# Patient Record
Sex: Female | Born: 2000 | Race: Black or African American | Hispanic: No | Marital: Single | Smoking: Never smoker
Health system: Southern US, Community
[De-identification: ages and names within clinical notes are randomized; demographics above are authoritative.]

## PROBLEM LIST (undated history)

## (undated) DIAGNOSIS — R0981 Nasal congestion: Secondary | ICD-10-CM

## (undated) DIAGNOSIS — J302 Other seasonal allergic rhinitis: Secondary | ICD-10-CM

## (undated) DIAGNOSIS — J353 Hypertrophy of tonsils with hypertrophy of adenoids: Secondary | ICD-10-CM

---
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
## 2018-01-12 ENCOUNTER — Ambulatory Visit
Admission: RE | Admit: 2018-01-12 | Discharge: 2018-01-12 | Disposition: A | Discharge status: Home / Self Care | Payer: Medicaid Other | Source: Ambulatory Visit | Attending: Pediatrics | Admitting: Pediatrics

## 2018-01-12 DIAGNOSIS — R1030 Lower abdominal pain, unspecified: Secondary | ICD-10-CM

## 2018-01-12 DIAGNOSIS — R102 Pelvic and perineal pain: Secondary | ICD-10-CM | POA: Diagnosis not present

## 2018-01-23 ENCOUNTER — Other Ambulatory Visit (HOSPITAL_COMMUNITY): Payer: Medicaid Other

## 2018-01-23 ENCOUNTER — Ambulatory Visit (HOSPITAL_COMMUNITY)
Admission: RE | Admit: 2018-01-23 | Discharge: 2018-01-23 | Disposition: A | Discharge status: Home / Self Care | Payer: Medicaid Other | Source: Ambulatory Visit | Attending: Pediatrics | Admitting: Pediatrics

## 2018-01-23 DIAGNOSIS — R079 Chest pain, unspecified: Secondary | ICD-10-CM | POA: Diagnosis not present

## 2018-02-03 ENCOUNTER — Encounter: Payer: Self-pay | Admitting: Pediatrics

## 2018-02-08 ENCOUNTER — Emergency Department (HOSPITAL_COMMUNITY)
Admission: EM | Admit: 2018-02-08 | Discharge: 2018-02-08 | Disposition: A | Discharge status: Home / Self Care | Payer: Medicaid Other | Attending: Emergency Medicine | Admitting: Emergency Medicine

## 2018-02-08 ENCOUNTER — Encounter (HOSPITAL_COMMUNITY): Payer: Self-pay | Admitting: Emergency Medicine

## 2018-02-08 ENCOUNTER — Other Ambulatory Visit: Payer: Self-pay

## 2018-02-08 DIAGNOSIS — R079 Chest pain, unspecified: Secondary | ICD-10-CM | POA: Insufficient documentation

## 2018-02-08 DIAGNOSIS — J45909 Unspecified asthma, uncomplicated: Secondary | ICD-10-CM | POA: Insufficient documentation

## 2018-02-08 DIAGNOSIS — R0789 Other chest pain: Secondary | ICD-10-CM | POA: Diagnosis not present

## 2018-02-08 LAB — BASIC METABOLIC PANEL
ANION GAP: 9 (ref 5–15)
BUN: 14 mg/dL (ref 6–20)
CALCIUM: 9.2 mg/dL (ref 8.9–10.3)
CHLORIDE: 106 mmol/L (ref 98–111)
CO2: 24 mmol/L (ref 22–32)
Creatinine, Ser: 0.71 mg/dL (ref 0.44–1.00)
GFR calc non Af Amer: >60 mL/min (ref >60)
Glucose, Bld: 88 mg/dL (ref 70–99)
Potassium: 3.7 mmol/L (ref 3.5–5.1)
Sodium: 139 mmol/L (ref 135–145)

## 2018-02-08 LAB — CBC
HCT: 38.1 % (ref 36.0–46.0)
HEMOGLOBIN: 13 g/dL (ref 12.0–15.0)
MCH: 27.1 pg (ref 26.0–34.0)
MCHC: 34.1 g/dL (ref 30.0–36.0)
MCV: 79.4 fL (ref 78.0–100.0)
Platelets: 305 10*3/uL (ref 150–400)
RBC: 4.8 MIL/uL (ref 3.87–5.11)
RDW: 14.4 % (ref 11.5–15.5)
WBC: 6.9 10*3/uL (ref 4.0–10.5)

## 2018-02-08 LAB — I-STAT TROPONIN, ED: Troponin i, poc: 0 ng/mL (ref 0.00–0.08)

## 2018-02-08 MED ORDER — GI COCKTAIL ~~LOC~~
30.0000 mL | Freq: Once | ORAL | Status: AC
  Administered 2018-02-08: 30 mL via ORAL
  Filled 2018-02-08: qty 30

## 2018-02-08 NOTE — Discharge Instructions (Signed)
Your EKG, blood work and cardiac marker were all normal today. This is good news. I am very confident that your chest pain is not coming from the heart or lungs. With your history and complaints, your chest pain could be coming from a musculoskeletal issue (such as muscle strain), GI related (such as heart burn) or stress.  I have placed information for a cardiology group below, but I encourage you to discuss this further with your PCP before seeing the cardiologist.

## 2018-02-08 NOTE — ED Triage Notes (Addendum)
Pt c/o R chest pain and R axilla pain x 3 months. Pt states pain has worsened today, pain is sharp and intermittent.

## 2018-02-08 NOTE — ED Provider Notes (Signed)
[facility]-EMERGENCY DEPT Provider Note  CSN: [id number] Arrival date & time: 02/08/18  1825  History   Chief Complaint Chief Complaint  Patient presents with  . Chest Pain    R axilla   HPI Adriana Wright is a 17 y.o. female with a medical history of asthma and allergies who presented to the ED for right sided chest pain x3 months. She describes intermittent pain that changes in quality (sharp vs achy vs dull) and sometimes radiates to her back. Patient states that she has some episodes where she has had SOB or abdominal pain. Episodes typically last 5-10 minutes and resolve on their own without intervention, but states today the pain went on for 2 hours. Patient has tried Albuterol inhaler prior to coming to the ED. Denies fever, palpitations, leg swelling, diaphoresis, N/V, change in bowel habits, urinary or vaginal complaints.  Past Medical History:  Diagnosis Date  . Adenotonsillar hypertrophy 09/2011   no snoring/apnea/waking up coughing or choking, per mother  . Asthma    prn inhaler  . Nasal congestion 10/03/2011  . Seasonal allergies     Patient Active Problem List   Diagnosis Date Noted  . Adjustment disorder with anxiety 10/23/2015  . Rhinitis, allergic 07/18/2015  . Bereavement 07/18/2015  . Acne 07/16/2013  . Mild persistent asthma, well controlled 07/16/2013  . Dysmenorrhea 07/16/2013    Past Surgical History:  Procedure Laterality Date  . TONSILLECTOMY AND ADENOIDECTOMY  10/08/2011   Procedure: TONSILLECTOMY AND ADENOIDECTOMY;  Surgeon: Darletta Moll, MD;  Location: [facility];  Service: ENT;  Laterality: N/A;     OB History    Gravida  0   Para  0   Term  0   Preterm  0   AB  0   Living  0     SAB  0   TAB  0   Ectopic  0   Multiple  0   Live Births               Home Medications    Prior to Admission medications   Medication Sig Start Date End Date Taking? Authorizing Provider  albuterol  (PROAIR HFA) 108 (90 Base) MCG/ACT inhaler Inhale 1-2 puffs into the lungs every 4 (four) hours as needed for wheezing or shortness of breath. Always use spacer. 12/30/16  Yes Prose, Bunnlevel Bing, MD  cetirizine (ZYRTEC) 10 MG tablet Take 1 tablet (10 mg total) by mouth daily. Patient not taking: Reported on 02/08/2018 07/18/15   Theadore Nan, MD    Family History Family History  Problem Relation Age of Onset  . Sickle cell trait Father   . Asthma Sister   . Lung disease Sister        pulmonary HTN, chronic lung disease  . Seizures Brother   . Sickle cell trait Brother     Social History Social History   Tobacco Use  . Smoking status: Never Smoker  . Smokeless tobacco: Never Used  Substance Use Topics  . Alcohol use: No    Alcohol/week: 0.0 standard drinks  . Drug use: No     Allergies   Penicillins   Review of Systems Review of Systems  Constitutional: Negative for chills, diaphoresis, fatigue and fever.  HENT: Negative.   Eyes: Negative for visual disturbance.  Respiratory: Positive for shortness of breath. Negative for cough, chest tightness and wheezing.   Cardiovascular: Positive for chest pain. Negative for palpitations and leg swelling.  Gastrointestinal:  Positive for abdominal pain. Negative for constipation, diarrhea, nausea and vomiting.  Genitourinary: Negative.   Musculoskeletal: Negative.   Skin: Negative.   Neurological: Negative for dizziness, weakness, light-headedness and numbness.  Psychiatric/Behavioral: Negative.    Physical Exam Updated Vital Signs BP (!) 132/99 (BP Location: Right Arm)   Pulse 79   Temp 98.7 F (37.1 C) (Oral)   Resp 17   SpO2 98%   Physical Exam  Constitutional: Vital signs are normal. She appears well-developed and well-nourished. She is cooperative. No distress.  HENT:  Head: Normocephalic and atraumatic.  Eyes: Pupils are equal, round, and reactive to light. Conjunctivae, EOM and lids are normal.  Cardiovascular:  Normal rate, regular rhythm, normal heart sounds, intact distal pulses and normal pulses.  No murmur heard. Pulmonary/Chest: Effort normal and breath sounds normal. She exhibits no tenderness.  Abdominal: Soft. Normal appearance and bowel sounds are normal. There is no tenderness.  Musculoskeletal:       Right lower leg: She exhibits no edema.       Left lower leg: She exhibits no edema.  Neurological: She is alert.  Skin: Skin is warm. Capillary refill takes less than 2 seconds.  Psychiatric: She has a normal mood and affect. Her speech is normal and behavior is normal. Thought content normal. Cognition and memory are normal.  Nursing note and vitals reviewed.  ED Treatments / Results  Labs (all labs ordered are listed, but only abnormal results are displayed) Labs Reviewed  BASIC METABOLIC PANEL  CBC  I-STAT TROPONIN, ED    EKG EKG Interpretation  Date/Time:  Sunday February 08 2018 21:03:43 EDT Ventricular Rate:  70 PR Interval:  128 QRS Duration: 84 QT Interval:  390 QTC Calculation: 421 R Axis:   82 Text Interpretation:  Sinus rhythm with marked sinus arrhythmia Otherwise normal ECG since last tracing no significant change Confirmed by Mancel Bale [phone]) on 02/08/2018 9:10:16 PM   Radiology No results found.  Procedures Procedures (including critical care time)  Medications Ordered in ED Medications  gi cocktail (Maalox,Lidocaine,Donnatal) (30 mLs Oral Given 02/08/18 2129)     Initial Impression / Assessment and Plan / ED Course  Triage vital signs and the nursing notes have been reviewed.  Pertinent labs & imaging results that were available during care of the patient were reviewed and considered in medical decision making (see chart for details).  Patient presents to ED for right sided chest pain that has been ongoing for 3 months. Currently patient is asymptomatic and well appearing. Her vital signs are normal. History and patient's clinical presentation is  conflicting as she states that pain radiates and changes in quality. HEART score of 0, PERC score of 0 and no family history of MI or cardiac issues. Physical exam is unremarkable. Will do evaluation for cardiac work-up.  Clinical Course as of Feb 08 2233  Wynelle Link Feb 08, 2018  2202 EKG showed NSR. No ST elevations/depressions or signs of acute ischemia or infarct. This is reassuring in combination with negative troponin which assists in evaluating and ruling out an acute cardiac process.   [GM]  2216 CBC and BMP unremarkable.   [GM]    Clinical Course User Index [GM] Tezra Mahr, Sharyon Medicus, PA-C    Final Clinical Impressions(s) / ED Diagnoses  1. Chest Pain. No clear etiology. Possibly MSK as patient has pain with arm movements and has recent increase in breast size which has also caused some back discomfort. Has discussed these complaints with PCP and is  advised to follow-up with them to discuss appropriate referral to cardiology. Education provided on s/s that warrant return to the ED vs PCP.  Dispo: Home. After thorough clinical evaluation, this patient is determined to be medically stable and can be safely discharged with the previously mentioned treatment and/or outpatient follow-up/referral(s). At this time, there are no other apparent medical conditions that require further screening, evaluation or treatment.   Final diagnoses:  Nonspecific chest pain    ED Discharge Orders    None        Reva Bores 02/08/18 2234    Mancel Bale, MD 02/09/18 [phone]

## 2018-03-11 DIAGNOSIS — Z3202 Encounter for pregnancy test, result negative: Secondary | ICD-10-CM | POA: Diagnosis not present

## 2018-03-11 DIAGNOSIS — Z114 Encounter for screening for human immunodeficiency virus [HIV]: Secondary | ICD-10-CM | POA: Diagnosis not present

## 2018-03-11 DIAGNOSIS — B373 Candidiasis of vulva and vagina: Secondary | ICD-10-CM | POA: Diagnosis not present

## 2018-03-11 DIAGNOSIS — Z113 Encounter for screening for infections with a predominantly sexual mode of transmission: Secondary | ICD-10-CM | POA: Diagnosis not present

## 2018-03-11 DIAGNOSIS — Z7251 High risk heterosexual behavior: Secondary | ICD-10-CM | POA: Diagnosis not present

## 2018-03-16 DIAGNOSIS — A5619 Other chlamydial genitourinary infection: Secondary | ICD-10-CM | POA: Diagnosis not present

## 2018-04-13 ENCOUNTER — Encounter: Payer: Self-pay | Admitting: Pediatrics

## 2018-04-24 ENCOUNTER — Ambulatory Visit (INDEPENDENT_AMBULATORY_CARE_PROVIDER_SITE_OTHER): Payer: Medicaid Other | Admitting: Pediatrics

## 2018-04-24 ENCOUNTER — Encounter: Payer: Self-pay | Admitting: Pediatrics

## 2018-04-24 VITALS — Wt 199.0 lb

## 2018-04-24 DIAGNOSIS — Z3009 Encounter for other general counseling and advice on contraception: Secondary | ICD-10-CM

## 2018-04-24 DIAGNOSIS — R079 Chest pain, unspecified: Secondary | ICD-10-CM

## 2018-04-24 DIAGNOSIS — Z3202 Encounter for pregnancy test, result negative: Secondary | ICD-10-CM | POA: Diagnosis not present

## 2018-04-24 DIAGNOSIS — Z3049 Encounter for surveillance of other contraceptives: Secondary | ICD-10-CM | POA: Diagnosis not present

## 2018-04-24 NOTE — Progress Notes (Signed)
   Subjective:     Adriana Wright, is a 17 y.o. female  HPI  Chief Complaint  Patient presents with  . Contraception  . Chest Pain   Would like a new form of contraception Stopped the depo in 07/2017 But now the period is back Recent menses: 9/9-9/12/19 none on 13th, started again yesterday 11/14 Light , but bleed, 2 pad, red blood  Priorperiod was on 4 days , off for 12 then on again for 4 days Before no periods since stopped depo  Cramping bad Not missing work or school, but hurts No using ibuprofen--didn't help last time  Was thinking the rod (neplanon) would be the best  New partner, using condoms Female partner since September  Still interested in breast reduction-- Chest and back hurt From large and growing breasts  Concerned that it is now in I (letter I ) cup, up from DD cup Band is 32 inch, can't find a bra last time tried Attributes long standing chest pain to large breast  Other recent concerns: Seen in ED 02/08/18 for Chest pain 12/2017: 3 concerns: abd pain, daily, sharp, wt gain of 15-20 lb Attributed wt gain to depo and not want more depo (pror depo 07/2017) Urinary incontinency 3- 4 times a week,  Chest pain is being squeezed,  No with activity, 2 min to 30 min, no SOB, no palpitations,   Pelvic  US- abd and trans vaginal 12/2017: normal ovaries,  No cause for pain, endometrial stripe appears to split near fundus--possible mildly septated uterus   Review of Systems  History and Problem List: Adriana Wright has Acne; Mild persistent asthma, well controlled; Dysmenorrhea; Rhinitis, allergic; Bereavement; and Adjustment disorder with anxiety on their problem list.  Adriana Wright  has a past medical history of Adenotonsillar hypertrophy (09/2011), Asthma, Nasal congestion (10/03/2011), and Seasonal allergies.     Objective:     Physical Exam  Brief exam Lungs: CTA Breast; large, asymmetric No murmur heard    Assessment & Plan:   1. Encounter for  counseling regarding contraception  Interested in nexplanon placement DR Manson PasseyBrown available for procedure today   2. Chest pain, unspecified type Long standing  Not found to be cardiac after prior evaluations Attributed to large breast Refer for consideration of breast reductions to Dr Ulice Boldillingham, Plastics.   3. Negative pregnancy test  - POCT urine pregnancy  Supportive care and return precautions reviewed.  Spent  25  minutes face to face time with patient; greater than 50% spent in counseling regarding diagnosis and treatment plan.   Theadore NanHilary Brooksie Ellwanger, MD

## 2018-04-24 NOTE — Patient Instructions (Signed)
Follow-up  in 1 month. Schedule this appointment before you leave clinic today.  Congratulations on getting your Nexplanon placement!  Below is some important information about Nexplanon.  First remember that Nexplanon does not prevent sexually transmitted infections.  Condoms will help prevent sexually transmitted infections. The Nexplanon starts working 7 days after it was inserted.  There is a risk of getting pregnant if you have unprotected sex in those first 7 days after placement of the Nexplanon.  The Nexplanon lasts for 3 years but can be removed at any time.  You can become pregnant as early as 1 week after removal.  You can have a new Nexplanon put in after the old one is removed if you like.  It is not known whether Nexplanon is as effective in women who are very overweight because the studies did not include many overweight women.  Nexplanon interacts with some medications, including barbiturates, bosentan, carbamazepine, felbamate, griseofulvin, oxcarbazepine, phenytoin, rifampin, St. John's wort, topiramate, HIV medicines.  Please alert your doctor if you are on any of these medicines.  Always tell other healthcare providers that you have a Nexplanon in your arm.  The Nexplanon was placed just under the skin.  Leave the outside bandage on for 24 hours.  Leave the smaller bandage on for 3-5 days or until it falls off on its own.  Keep the area clean and dry for 3-5 days. There is usually bruising or swelling at the insertion site for a few days to a week after placement.  If you see redness or pus draining from the insertion site, call us immediately.  Keep your user card with the date the implant was placed and the date the implant is to be removed.  The most common side effect is a change in your menstrual bleeding pattern.   This bleeding is generally not harmful to you but can be annoying.  Call or come in to see us if you have any concerns about the bleeding or if you have any  side effects or questions.    We will call you in 1 week to check in and we would like you to return to the clinic for a follow-up visit in 1 month.  You can call [facility] 24 hours a day with any questions or concerns.  There is always a nurse or doctor available to take your call.  Call 9-1-1 if you have a life-threatening emergency.  For anything else, please call us at [phone] before heading to the ER. 

## 2018-04-24 NOTE — Progress Notes (Signed)
Nexplanon Insertion  No contraindications for placement.  No liver disease, no unexplained vaginal bleeding, no h/o breast cancer, no h/o blood clots.  No LMP recorded. (Menstrual status: Irregular Periods).  Last started 04/23/18  UHCG: negative  Last Unprotected sex:  > 1 week  Risks & benefits of Nexplanon discussed The nexplanon device was purchased and supplied by [facility]. Packaging instructions supplied to patient Consent form signed  The patient denies any allergies to anesthetics or antiseptics.  Procedure: Pt was placed in supine position. The left arm was flexed at the elbow and externally rotated so that her wrist was parallel to her ear The medial epicondyle of the left arm was identified The insertions site was marked 8 cm proximal to the medial epicondyle The insertion site was cleaned with Betadine The area surrounding the insertion site was covered with a sterile drape 1% lidocaine was injected just under the skin at the insertion site extending 4 cm proximally. The sterile preloaded disposable Nexaplanon applicator was removed from the sterile packaging The applicator needle was inserted at a 30 degree angle at 8 cm proximal to the medial epicondyle as marked The applicator was lowered to a horizontal position and advanced just under the skin for the full length of the needle The slider on the applicator was retracted fully while the applicator remained in the same position, then the applicator was removed. The implant was confirmed via palpation as being in position The implant position was demonstrated to the patient Pressure dressing was applied to the patient.  The patient was instructed to removed the pressure dressing in 24 hrs.  The patient was advised to move slowly from a supine to an upright position  The patient denied any concerns or complaints  The patient was instructed to schedule a follow-up appt in 1 month and to call sooner if any  concerns.  The patient acknowledged agreement and understanding of the plan.

## 2018-04-25 DIAGNOSIS — Z3009 Encounter for other general counseling and advice on contraception: Secondary | ICD-10-CM | POA: Diagnosis not present

## 2018-04-25 DIAGNOSIS — Z3049 Encounter for surveillance of other contraceptives: Secondary | ICD-10-CM

## 2018-04-25 DIAGNOSIS — Z3202 Encounter for pregnancy test, result negative: Secondary | ICD-10-CM | POA: Diagnosis not present

## 2018-04-25 LAB — POCT URINE PREGNANCY: Preg Test, Ur: NEGATIVE

## 2018-04-25 MED ORDER — ETONOGESTREL 68 MG ~~LOC~~ IMPL
68.0000 mg | DRUG_IMPLANT | Freq: Once | SUBCUTANEOUS | Status: AC
  Administered 2018-04-25: 68 mg via SUBCUTANEOUS

## 2018-05-15 ENCOUNTER — Ambulatory Visit (INDEPENDENT_AMBULATORY_CARE_PROVIDER_SITE_OTHER): Payer: Medicaid Other | Admitting: Plastic Surgery

## 2018-05-15 ENCOUNTER — Encounter: Payer: Self-pay | Admitting: Plastic Surgery

## 2018-05-15 DIAGNOSIS — M549 Dorsalgia, unspecified: Secondary | ICD-10-CM | POA: Insufficient documentation

## 2018-05-15 DIAGNOSIS — M542 Cervicalgia: Secondary | ICD-10-CM | POA: Diagnosis not present

## 2018-05-15 DIAGNOSIS — M546 Pain in thoracic spine: Secondary | ICD-10-CM | POA: Diagnosis not present

## 2018-05-15 DIAGNOSIS — N62 Hypertrophy of breast: Secondary | ICD-10-CM | POA: Diagnosis not present

## 2018-05-15 DIAGNOSIS — G8929 Other chronic pain: Secondary | ICD-10-CM

## 2018-05-15 NOTE — Progress Notes (Signed)
Patient ID: Adriana GrimesStarasia T Dunnavant, female    DOB: 11/03/2000, 17 y.o.   MRN: [id number]   Chief Complaint  Patient presents with  . Breast Problem    Mammary Hyperplasia: The patient is a 17 y.o. female with a history of mammary hyperplasia for several years.  She has extremely large breasts causing symptoms that include the following: Back pain (upper and lower) and neck pain. She frequently pins bra cups higher on straps for better lift and relief. Notices relief when holding breast up in her hands. Shoulder straps causing grooves, pain occasionally requiring padding. Pain medication is sometimes required with motrin and tylenol.  Activities that are hindered by enlarged breasts include: running and exercise.  Proper fitting clothes.  She is currently a Consulting civil engineerstudent at [employer] and most of her classes are on line.  Her breasts are extremely large and fairly symmetric.  She has hyperpigmentation of the inframammary area on both sides.  The sternal to nipple distance on the right is 31 cm and the left is 32 cm.  The IMF distance is 15 cm.  She is 5 feet 3 inches tall and weighs 182 pounds.  Preoperative bra size = 32 I cup.  The estimated excess breast tissue to be removed at the time of surgery = 850 grams on the left and 850 grams on the right.  Mammogram history: none and no family history of breast cancer.  She was sent by her PCP for the issues of back, neck and chest wall pain.      Review of Systems  Constitutional: Positive for activity change. Negative for appetite change.  HENT: Negative.   Eyes: Negative.   Respiratory: Negative.   Gastrointestinal: Negative.   Endocrine: Negative.   Genitourinary: Negative.   Musculoskeletal: Positive for back pain and neck pain.  Skin: Negative.  Negative for color change and wound.  Neurological: Negative.   Psychiatric/Behavioral: Negative.     Past Medical History:  Diagnosis Date  . Adenotonsillar hypertrophy 09/2011   no  snoring/apnea/waking up coughing or choking, per mother  . Asthma    prn inhaler  . Nasal congestion 10/03/2011  . Seasonal allergies     Past Surgical History:  Procedure Laterality Date  . TONSILLECTOMY AND ADENOIDECTOMY  10/08/2011   Procedure: TONSILLECTOMY AND ADENOIDECTOMY;  Surgeon: Darletta MollSui W Teoh, MD;  [facility];  Service: ENT;  Laterality: N/A;      Current Outpatient Medications:  .  etonogestrel (NEXPLANON) 68 MG IMPL implant, 1 each by Subdermal route once., Disp: , Rfl:  .  albuterol (PROAIR HFA) 108 (90 Base) MCG/ACT inhaler, Inhale 1-2 puffs into the lungs every 4 (four) hours as needed for wheezing or shortness of breath. Always use spacer., Disp: 8.5 each, Rfl: 0 .  cetirizine (ZYRTEC) 10 MG tablet, Take 1 tablet (10 mg total) by mouth daily. (Patient not taking: Reported on 02/08/2018), Disp: 30 tablet, Rfl: 11   Objective:   Vitals:   05/15/18 1520  BP: 120/80  Pulse: 93  Resp: 14  SpO2: 95%    Physical Exam  Constitutional: She is oriented to person, place, and time. She appears well-developed and well-nourished.  HENT:  Head: Normocephalic and atraumatic.  Eyes: Pupils are equal, round, and reactive to light. EOM are normal.  Cardiovascular: Normal rate.  Pulmonary/Chest: Effort normal.  Abdominal: Soft. She exhibits no distension. There is no tenderness.  Neurological: She is alert and oriented to person, place, and time.  Skin: Skin is warm.  Psychiatric: She has a normal mood and affect. Her behavior is normal. Judgment and thought content normal.    Assessment & Plan:  Symptomatic mammary hypertrophy  Neck pain  Chronic bilateral thoracic back pain Recommend bilateral breast reduction.  We discussed the surgery, scars and recovery period. Alena Bills Carry Ortez, DO

## 2018-07-17 DIAGNOSIS — Z113 Encounter for screening for infections with a predominantly sexual mode of transmission: Secondary | ICD-10-CM | POA: Diagnosis not present

## 2018-07-30 ENCOUNTER — Encounter: Payer: Self-pay | Admitting: Plastic Surgery

## 2018-09-17 ENCOUNTER — Ambulatory Visit (INDEPENDENT_AMBULATORY_CARE_PROVIDER_SITE_OTHER): Payer: Medicaid Other | Admitting: Pediatrics

## 2018-09-17 ENCOUNTER — Telehealth: Payer: Self-pay | Admitting: *Deleted

## 2018-09-17 ENCOUNTER — Other Ambulatory Visit: Payer: Self-pay

## 2018-09-17 DIAGNOSIS — M546 Pain in thoracic spine: Secondary | ICD-10-CM | POA: Diagnosis not present

## 2018-09-17 DIAGNOSIS — R102 Pelvic and perineal pain unspecified side: Secondary | ICD-10-CM

## 2018-09-17 DIAGNOSIS — Z113 Encounter for screening for infections with a predominantly sexual mode of transmission: Secondary | ICD-10-CM | POA: Diagnosis not present

## 2018-09-17 DIAGNOSIS — N62 Hypertrophy of breast: Secondary | ICD-10-CM

## 2018-09-17 DIAGNOSIS — N946 Dysmenorrhea, unspecified: Secondary | ICD-10-CM

## 2018-09-17 DIAGNOSIS — G8929 Other chronic pain: Secondary | ICD-10-CM | POA: Diagnosis not present

## 2018-09-17 MED ORDER — CYCLOBENZAPRINE HCL 10 MG PO TABS: 10.0000 mg | ORAL_TABLET | Freq: Every day | ORAL | 0 refills | Status: DC

## 2018-09-17 NOTE — Telephone Encounter (Signed)
Caller is having pain in lower back and abdomen. Feels maybe related to nexplanon. Would like a call back.

## 2018-09-17 NOTE — Telephone Encounter (Signed)
Appointment made to discuss with provider.

## 2018-09-17 NOTE — Progress Notes (Signed)
Virtual Visit via Video Note  I connected with Adriana Wright 's patient  on 09/17/18 at  4:00 PM EDT by a video enabled telemedicine application and verified that I am speaking with the correct person using two identifiers.   [facility]    I discussed the limitations of evaluation and management by telemedicine and the availability of in person appointments.  I discussed that the purpose of this phone visit is to provide medical care while limiting exposure to the novel coronavirus.  The patient expressed understanding and agreed to proceed.  Reason for visit: Pelvic and back pain   History of Present Illness:  Having back pain x 5 days. Describes as tight. Heating pad helps, but then will get tight again when she discontinues use. Has a harder time bending over and just generally feels like she is moving slower. It is not impacting her ADLs to a large degree, but continues to be uncomfortable. She has not had an injury. Denies any numbness, tingling, weakness, incontinence.   Having pelvic pain that has been ongoing for the last few weeks. Is sharp in nature and in the middle. She has had ongoing self diagnosed yeast infections and vaginal odor for which she has used monistat once. Has nexplanon. Last sexually active about 1 month ago with same partner. Is not having any bleeding.   Continues to have the same chest pain r/t her mammary hypertrophy. Feels that her cup size has gotten even larger since she was seen by plastics. Waiting on insurance approval- last heard from plastics office in February.    Observations/Objective:   Physical Exam Constitutional:      Appearance: She is obese.     Comments: Walking around comfortably in [employer]  Pulmonary:     Effort: Pulmonary effort is normal.  Musculoskeletal: Normal range of motion.     Lumbar back: She exhibits tenderness.  Neurological:     General: No focal deficit present.     Mental Status: She is  alert and oriented to person, place, and time.  Psychiatric:        Mood and Affect: Mood normal.    Assessment and Plan:  1. Symptomatic mammary hypertrophy Advised to check in with plastics again to see where the are with insurance approval.   2. Chronic bilateral thoracic back pain Will try flexeril at bedtime to help with spasm. Can also use ibuprofen PRN.  - cyclobenzaprine (FLEXERIL) 10 MG tablet; Take 1 tablet (10 mg total) by mouth at bedtime.  Dispense: 30 tablet; Refill: 0  3. Dysmenorrhea Her pelvic pain could be cramping related, although sounds different. Is not having periods with nexplanon.   4. Pelvic pain Will r/o infection on Monday with in clinic swabs. No alarming things today, don't feel this is PID.   5. Routine screening for STI (sexually transmitted infection) Orders placed for Monday.  - WET PREP BY MOLECULAR PROBE; Future - C. trachomatis/N. gonorrhoeae RNA; Future   Follow Up Instructions: F/u PRN for no improvement or worsening.    I discussed the assessment and treatment plan with the patient and/or parent/guardian. They were provided an opportunity to ask questions and all were answered. They agreed with the plan and demonstrated an understanding of the instructions.   They were advised to call back or seek an in-person evaluation in the emergency room if the symptoms worsen or if the condition fails to improve as anticipated.  I provided 15 minutes of non-face-to-face time  during this encounter. I was located at off site  during this encounter.  Alfonso Ramus, FNP

## 2018-09-21 ENCOUNTER — Other Ambulatory Visit: Payer: Self-pay

## 2018-09-21 ENCOUNTER — Ambulatory Visit: Payer: Medicaid Other | Admitting: Family

## 2018-09-21 DIAGNOSIS — Z113 Encounter for screening for infections with a predominantly sexual mode of transmission: Secondary | ICD-10-CM

## 2018-09-22 ENCOUNTER — Other Ambulatory Visit: Payer: Self-pay | Admitting: Pediatrics

## 2018-09-22 DIAGNOSIS — A599 Trichomoniasis, unspecified: Secondary | ICD-10-CM

## 2018-09-22 DIAGNOSIS — A749 Chlamydial infection, unspecified: Secondary | ICD-10-CM

## 2018-09-22 LAB — WET PREP BY MOLECULAR PROBE
Candida species: NOT DETECTED
Gardnerella vaginalis: NOT DETECTED
MICRO NUMBER:: 391315
SPECIMEN QUALITY:: ADEQUATE
Trichomonas vaginosis: DETECTED — AB

## 2018-09-22 LAB — C. TRACHOMATIS/N. GONORRHOEAE RNA
C. trachomatis RNA, TMA: DETECTED — AB
N. gonorrhoeae RNA, TMA: NOT DETECTED

## 2018-09-22 MED ORDER — AZITHROMYCIN 500 MG PO TABS: 1000.0000 mg | ORAL_TABLET | Freq: Once | ORAL | 0 refills | Status: AC

## 2018-09-22 MED ORDER — METRONIDAZOLE 500 MG PO TABS: 2000.0000 mg | ORAL_TABLET | Freq: Once | ORAL | 0 refills | Status: AC

## 2018-10-29 ENCOUNTER — Telehealth: Payer: Self-pay | Admitting: Plastic Surgery

## 2018-10-29 NOTE — Telephone Encounter (Signed)

## 2018-10-30 ENCOUNTER — Encounter: Payer: Self-pay | Admitting: Plastic Surgery

## 2018-10-30 ENCOUNTER — Other Ambulatory Visit: Payer: Self-pay

## 2018-10-30 ENCOUNTER — Ambulatory Visit (INDEPENDENT_AMBULATORY_CARE_PROVIDER_SITE_OTHER): Payer: Medicaid Other | Admitting: Plastic Surgery

## 2018-10-30 VITALS — BP 119/81 | HR 80 | Temp 98.8°F | Ht 63.0 in | Wt 189.4 lb

## 2018-10-30 DIAGNOSIS — G8929 Other chronic pain: Secondary | ICD-10-CM

## 2018-10-30 DIAGNOSIS — N62 Hypertrophy of breast: Secondary | ICD-10-CM

## 2018-10-30 DIAGNOSIS — M546 Pain in thoracic spine: Secondary | ICD-10-CM

## 2018-10-30 DIAGNOSIS — M542 Cervicalgia: Secondary | ICD-10-CM

## 2018-10-30 MED ORDER — ONDANSETRON HCL 4 MG PO TABS: 4.0000 mg | ORAL_TABLET | Freq: Three times a day (TID) | ORAL | 0 refills | Status: AC | PRN

## 2018-10-30 MED ORDER — HYDROCODONE-ACETAMINOPHEN 5-325 MG PO TABS: 1.0000 | ORAL_TABLET | Freq: Four times a day (QID) | ORAL | 0 refills | Status: DC | PRN

## 2018-10-30 MED ORDER — HYDROCODONE-ACETAMINOPHEN 5-325 MG PO TABS: 1.0000 | ORAL_TABLET | Freq: Four times a day (QID) | ORAL | 0 refills | Status: AC | PRN

## 2018-10-30 MED ORDER — ONDANSETRON HCL 4 MG PO TABS: 4.0000 mg | ORAL_TABLET | Freq: Three times a day (TID) | ORAL | 0 refills | Status: DC | PRN

## 2018-10-30 MED ORDER — CIPROFLOXACIN HCL 500 MG PO TABS: 500.0000 mg | ORAL_TABLET | Freq: Two times a day (BID) | ORAL | 0 refills | Status: AC

## 2018-10-30 MED ORDER — CIPROFLOXACIN HCL 500 MG PO TABS: 500.0000 mg | ORAL_TABLET | Freq: Two times a day (BID) | ORAL | 0 refills | Status: DC

## 2018-10-30 NOTE — H&P (View-Only) (Signed)
Patient ID: Adriana Wright, female    DOB: 06/03/2001, 18 y.o.   MRN: [id number]   Chief Complaint  Patient presents with  . Pre-op Exam    for (B) breast reduction  . Breast Problem    The patient is a 18 year old female here with mom for a preoperative history and physical for mammary hyperplasia.  She has extremely large breasts that have been causing back and neck pain.  She made the decision to have a bilateral breast reduction.  This is the plan.  Her nipple to areole a distance on the right is 31 cm and 32 cm on the left she is 5 feet 3 inches tall and weighs 182 pounds.  Her preoperative bra is a 32 I cup.  We have estimated removal of 850 g bilaterally.  She is not had any recent illnesses or significant change in her weight.   Review of Systems  Constitutional: Positive for activity change. Negative for appetite change.  HENT: Negative.   Eyes: Negative.   Respiratory: Negative.  Negative for shortness of breath.   Cardiovascular: Negative.   Gastrointestinal: Negative.   Endocrine: Negative.   Genitourinary: Negative.   Musculoskeletal: Negative.   Skin: Negative.  Negative for color change and wound.  Hematological: Negative.   Psychiatric/Behavioral: Negative.     Past Medical History:  Diagnosis Date  . Adenotonsillar hypertrophy 09/2011   no snoring/apnea/waking up coughing or choking, per mother  . Asthma    prn inhaler  . Nasal congestion 10/03/2011  . Seasonal allergies     Past Surgical History:  Procedure Laterality Date  . TONSILLECTOMY AND ADENOIDECTOMY  10/08/2011   Procedure: TONSILLECTOMY AND ADENOIDECTOMY;  Surgeon: Darletta MollSui W Teoh, MD;  Location: [facility];  Service: ENT;  Laterality: N/A;      Current Outpatient Medications:  .  albuterol (PROAIR HFA) 108 (90 Base) MCG/ACT inhaler, Inhale 1-2 puffs into the lungs every 4 (four) hours as needed for wheezing or shortness of breath. Always use spacer., Disp: 8.5 each, Rfl: 0 .   cyclobenzaprine (FLEXERIL) 10 MG tablet, Take 1 tablet (10 mg total) by mouth at bedtime., Disp: 30 tablet, Rfl: 0 .  etonogestrel (NEXPLANON) 68 MG IMPL implant, 1 each by Subdermal route once., Disp: , Rfl:  .  ciprofloxacin (CIPRO) 500 MG tablet, Take 1 tablet (500 mg total) by mouth 2 (two) times daily for 3 days., Disp: 6 tablet, Rfl: 0 .  ondansetron (ZOFRAN) 4 MG tablet, Take 1 tablet (4 mg total) by mouth every 8 (eight) hours as needed for up to 5 days., Disp: 15 tablet, Rfl: 0   Objective:   Vitals:   10/30/18 1357  BP: 119/81  Pulse: 80  Temp: 98.8 F (37.1 C)  SpO2: 98%    Physical Exam Vitals signs and nursing note reviewed.  Constitutional:      Appearance: Normal appearance.  HENT:     Head: Normocephalic and atraumatic.  Cardiovascular:     Rate and Rhythm: Normal rate.  Pulmonary:     Effort: Pulmonary effort is normal.  Abdominal:     General: Abdomen is flat. There is no distension.  Neurological:     General: No focal deficit present.     Mental Status: She is alert and oriented to person, place, and time.  Psychiatric:        Mood and Affect: Mood normal.        Behavior: Behavior normal.  Assessment & Plan:  Symptomatic mammary hypertrophy  Chronic bilateral thoracic back pain  Neck pain Plan for bilateral breast reduction.   Prescription sent into pharmacy The risk that can be encountered with breast reduction were discussed and include the following but not limited to these:  Breast asymmetry, fluid accumulation, firmness of the breast, inability to breast feed, loss of nipple or areola, skin loss, decrease or no nipple sensation, fat necrosis of the breast tissue, bleeding, infection, healing delay.  There are risks of anesthesia, changes to skin sensation and injury to nerves or blood vessels.  The muscle can be temporarily or permanently injured.  You may have an allergic reaction to tape, suture, glue, blood products which can result in  skin discoloration, swelling, pain, skin lesions, poor healing.  Any of these can lead to the need for revisonal surgery or stage procedures.  A reduction has potential to interfere with diagnostic procedures.  Nipple or breast piercing can increase risks of infection.  This procedure is best done when the breast is fully developed.  Changes in the breast will continue to occur over time.  Pregnancy can alter the outcomes of previous breast reduction surgery, weight gain and weigh loss can also effect the long term appearance.     Alena Bills Dillingham, DO

## 2018-10-30 NOTE — Progress Notes (Signed)
   Patient ID: Adriana Wright, female    DOB: 02/09/2001, 18 y.o.   MRN: [id number]   Chief Complaint  Patient presents with  . Pre-op Exam    for (B) breast reduction  . Breast Problem    The patient is a 18-year-old female here with mom for a preoperative history and physical for mammary hyperplasia.  She has extremely large breasts that have been causing back and neck pain.  She made the decision to have a bilateral breast reduction.  This is the plan.  Her nipple to areole a distance on the right is 31 cm and 32 cm on the left she is 5 feet 3 inches tall and weighs 182 pounds.  Her preoperative bra is a 32 I cup.  We have estimated removal of 850 g bilaterally.  She is not had any recent illnesses or significant change in her weight.   Review of Systems  Constitutional: Positive for activity change. Negative for appetite change.  HENT: Negative.   Eyes: Negative.   Respiratory: Negative.  Negative for shortness of breath.   Cardiovascular: Negative.   Gastrointestinal: Negative.   Endocrine: Negative.   Genitourinary: Negative.   Musculoskeletal: Negative.   Skin: Negative.  Negative for color change and wound.  Hematological: Negative.   Psychiatric/Behavioral: Negative.     Past Medical History:  Diagnosis Date  . Adenotonsillar hypertrophy 09/2011   no snoring/apnea/waking up coughing or choking, per mother  . Asthma    prn inhaler  . Nasal congestion 10/03/2011  . Seasonal allergies     Past Surgical History:  Procedure Laterality Date  . TONSILLECTOMY AND ADENOIDECTOMY  10/08/2011   Procedure: TONSILLECTOMY AND ADENOIDECTOMY;  Surgeon: Sui W Teoh, MD;  Location: [facility];  Service: ENT;  Laterality: N/A;      Current Outpatient Medications:  .  albuterol (PROAIR HFA) 108 (90 Base) MCG/ACT inhaler, Inhale 1-2 puffs into the lungs every 4 (four) hours as needed for wheezing or shortness of breath. Always use spacer., Disp: 8.5 each, Rfl: 0 .   cyclobenzaprine (FLEXERIL) 10 MG tablet, Take 1 tablet (10 mg total) by mouth at bedtime., Disp: 30 tablet, Rfl: 0 .  etonogestrel (NEXPLANON) 68 MG IMPL implant, 1 each by Subdermal route once., Disp: , Rfl:  .  ciprofloxacin (CIPRO) 500 MG tablet, Take 1 tablet (500 mg total) by mouth 2 (two) times daily for 3 days., Disp: 6 tablet, Rfl: 0 .  ondansetron (ZOFRAN) 4 MG tablet, Take 1 tablet (4 mg total) by mouth every 8 (eight) hours as needed for up to 5 days., Disp: 15 tablet, Rfl: 0   Objective:   Vitals:   10/30/18 1357  BP: 119/81  Pulse: 80  Temp: 98.8 F (37.1 C)  SpO2: 98%    Physical Exam Vitals signs and nursing note reviewed.  Constitutional:      Appearance: Normal appearance.  HENT:     Head: Normocephalic and atraumatic.  Cardiovascular:     Rate and Rhythm: Normal rate.  Pulmonary:     Effort: Pulmonary effort is normal.  Abdominal:     General: Abdomen is flat. There is no distension.  Neurological:     General: No focal deficit present.     Mental Status: She is alert and oriented to person, place, and time.  Psychiatric:        Mood and Affect: Mood normal.        Behavior: Behavior normal.       Assessment & Plan:  Symptomatic mammary hypertrophy  Chronic bilateral thoracic back pain  Neck pain Plan for bilateral breast reduction.   Prescription sent into pharmacy The risk that can be encountered with breast reduction were discussed and include the following but not limited to these:  Breast asymmetry, fluid accumulation, firmness of the breast, inability to breast feed, loss of nipple or areola, skin loss, decrease or no nipple sensation, fat necrosis of the breast tissue, bleeding, infection, healing delay.  There are risks of anesthesia, changes to skin sensation and injury to nerves or blood vessels.  The muscle can be temporarily or permanently injured.  You may have an allergic reaction to tape, suture, glue, blood products which can result in  skin discoloration, swelling, pain, skin lesions, poor healing.  Any of these can lead to the need for revisonal surgery or stage procedures.  A reduction has potential to interfere with diagnostic procedures.  Nipple or breast piercing can increase risks of infection.  This procedure is best done when the breast is fully developed.  Changes in the breast will continue to occur over time.  Pregnancy can alter the outcomes of previous breast reduction surgery, weight gain and weigh loss can also effect the long term appearance.     Alena Bills Deion Swift, DO

## 2018-11-03 ENCOUNTER — Encounter (HOSPITAL_BASED_OUTPATIENT_CLINIC_OR_DEPARTMENT_OTHER): Payer: Self-pay | Admitting: *Deleted

## 2018-11-03 ENCOUNTER — Other Ambulatory Visit: Payer: Self-pay

## 2018-11-05 ENCOUNTER — Other Ambulatory Visit (HOSPITAL_COMMUNITY)
Admission: RE | Admit: 2018-11-05 | Discharge: 2018-11-05 | Disposition: A | Discharge status: Home / Self Care | Payer: Medicaid Other | Source: Ambulatory Visit | Attending: Plastic Surgery | Admitting: Plastic Surgery

## 2018-11-05 DIAGNOSIS — Z1159 Encounter for screening for other viral diseases: Secondary | ICD-10-CM | POA: Diagnosis not present

## 2018-11-06 LAB — NOVEL CORONAVIRUS, NAA (HOSP ORDER, SEND-OUT TO REF LAB; TAT 18-24 HRS): SARS-CoV-2, NAA: NOT DETECTED

## 2018-11-09 ENCOUNTER — Ambulatory Visit (HOSPITAL_BASED_OUTPATIENT_CLINIC_OR_DEPARTMENT_OTHER)
Admission: RE | Admit: 2018-11-09 | Discharge: 2018-11-09 | Disposition: A | Discharge status: Home / Self Care | Payer: Medicaid Other | Attending: Plastic Surgery | Admitting: Plastic Surgery

## 2018-11-09 ENCOUNTER — Encounter (HOSPITAL_BASED_OUTPATIENT_CLINIC_OR_DEPARTMENT_OTHER)
Admission: RE | Disposition: A | Discharge status: Home / Self Care | Payer: Self-pay | Source: Home / Self Care | Attending: Plastic Surgery

## 2018-11-09 ENCOUNTER — Ambulatory Visit (HOSPITAL_BASED_OUTPATIENT_CLINIC_OR_DEPARTMENT_OTHER): Payer: Medicaid Other | Admitting: Certified Registered"

## 2018-11-09 ENCOUNTER — Encounter (HOSPITAL_BASED_OUTPATIENT_CLINIC_OR_DEPARTMENT_OTHER): Payer: Self-pay | Admitting: Certified Registered"

## 2018-11-09 DIAGNOSIS — Z793 Long term (current) use of hormonal contraceptives: Secondary | ICD-10-CM | POA: Diagnosis not present

## 2018-11-09 DIAGNOSIS — M546 Pain in thoracic spine: Secondary | ICD-10-CM | POA: Diagnosis not present

## 2018-11-09 DIAGNOSIS — G8929 Other chronic pain: Secondary | ICD-10-CM | POA: Diagnosis not present

## 2018-11-09 DIAGNOSIS — J4530 Mild persistent asthma, uncomplicated: Secondary | ICD-10-CM | POA: Diagnosis not present

## 2018-11-09 DIAGNOSIS — N946 Dysmenorrhea, unspecified: Secondary | ICD-10-CM | POA: Diagnosis not present

## 2018-11-09 DIAGNOSIS — N62 Hypertrophy of breast: Secondary | ICD-10-CM | POA: Insufficient documentation

## 2018-11-09 DIAGNOSIS — J45909 Unspecified asthma, uncomplicated: Secondary | ICD-10-CM | POA: Insufficient documentation

## 2018-11-09 DIAGNOSIS — M549 Dorsalgia, unspecified: Secondary | ICD-10-CM | POA: Diagnosis not present

## 2018-11-09 DIAGNOSIS — M542 Cervicalgia: Secondary | ICD-10-CM | POA: Insufficient documentation

## 2018-11-09 HISTORY — PX: BREAST REDUCTION SURGERY: SHX8

## 2018-11-09 SURGERY — MAMMOPLASTY, REDUCTION
Anesthesia: General | Site: Breast | Laterality: Bilateral

## 2018-11-09 MED ORDER — FENTANYL CITRATE (PF) 100 MCG/2ML IJ SOLN
INTRAMUSCULAR | Status: AC
  Filled 2018-11-09: qty 2

## 2018-11-09 MED ORDER — SUGAMMADEX SODIUM 200 MG/2ML IV SOLN
INTRAVENOUS | Status: DC | PRN
  Administered 2018-11-09: 200 mg via INTRAVENOUS

## 2018-11-09 MED ORDER — ONDANSETRON HCL 4 MG/2ML IJ SOLN
INTRAMUSCULAR | Status: AC
  Filled 2018-11-09: qty 8

## 2018-11-09 MED ORDER — SUGAMMADEX SODIUM 200 MG/2ML IV SOLN
INTRAVENOUS | Status: AC
  Filled 2018-11-09: qty 2

## 2018-11-09 MED ORDER — PROPOFOL 10 MG/ML IV BOLUS
INTRAVENOUS | Status: AC
  Filled 2018-11-09: qty 20

## 2018-11-09 MED ORDER — MIDAZOLAM HCL 2 MG/2ML IJ SOLN
INTRAMUSCULAR | Status: AC
  Filled 2018-11-09: qty 2

## 2018-11-09 MED ORDER — DEXAMETHASONE SODIUM PHOSPHATE 10 MG/ML IJ SOLN
INTRAMUSCULAR | Status: AC
  Filled 2018-11-09: qty 1

## 2018-11-09 MED ORDER — PROPOFOL 500 MG/50ML IV EMUL
INTRAVENOUS | Status: DC | PRN
  Administered 2018-11-09: 25 ug/kg/min via INTRAVENOUS

## 2018-11-09 MED ORDER — OXYCODONE HCL 5 MG PO TABS: 5.0000 mg | ORAL_TABLET | ORAL | Status: DC | PRN

## 2018-11-09 MED ORDER — PROPOFOL 500 MG/50ML IV EMUL
INTRAVENOUS | Status: AC
  Filled 2018-11-09: qty 50

## 2018-11-09 MED ORDER — CIPROFLOXACIN IN D5W 400 MG/200ML IV SOLN
400.0000 mg | INTRAVENOUS | Status: AC
  Administered 2018-11-09: 400 mg via INTRAVENOUS

## 2018-11-09 MED ORDER — FENTANYL CITRATE (PF) 100 MCG/2ML IJ SOLN
25.0000 ug | INTRAMUSCULAR | Status: DC | PRN
  Administered 2018-11-09 (×2): 50 ug via INTRAVENOUS

## 2018-11-09 MED ORDER — ONDANSETRON HCL 4 MG/2ML IJ SOLN: 4.0000 mg | Freq: Once | INTRAMUSCULAR | Status: DC | PRN

## 2018-11-09 MED ORDER — SODIUM CHLORIDE 0.9 % IR SOLN
Status: DC | PRN
  Administered 2018-11-09: 250 mL

## 2018-11-09 MED ORDER — BUPIVACAINE-EPINEPHRINE (PF) 0.25% -1:200000 IJ SOLN
INTRAMUSCULAR | Status: AC
  Filled 2018-11-09: qty 30

## 2018-11-09 MED ORDER — ACETAMINOPHEN 325 MG PO TABS: 650.0000 mg | ORAL_TABLET | ORAL | Status: DC | PRN

## 2018-11-09 MED ORDER — SODIUM CHLORIDE 0.9 % IV SOLN: 250.0000 mL | INTRAVENOUS | Status: DC | PRN

## 2018-11-09 MED ORDER — SCOPOLAMINE 1 MG/3DAYS TD PT72: 1.0000 | MEDICATED_PATCH | Freq: Once | TRANSDERMAL | Status: DC | PRN

## 2018-11-09 MED ORDER — LIDOCAINE 2% (20 MG/ML) 5 ML SYRINGE
INTRAMUSCULAR | Status: AC
  Filled 2018-11-09: qty 10

## 2018-11-09 MED ORDER — PHENYLEPHRINE HCL (PRESSORS) 10 MG/ML IV SOLN
INTRAVENOUS | Status: DC | PRN
  Administered 2018-11-09: 40 ug via INTRAVENOUS

## 2018-11-09 MED ORDER — LACTATED RINGERS IV SOLN
INTRAVENOUS | Status: DC
  Administered 2018-11-09 (×2): via INTRAVENOUS

## 2018-11-09 MED ORDER — CHLORHEXIDINE GLUCONATE 4 % EX LIQD: 1.0000 "application " | Freq: Once | CUTANEOUS | Status: DC

## 2018-11-09 MED ORDER — OXYCODONE HCL 5 MG PO TABS: 5.0000 mg | ORAL_TABLET | Freq: Once | ORAL | Status: DC | PRN

## 2018-11-09 MED ORDER — OXYCODONE HCL 5 MG/5ML PO SOLN: 5.0000 mg | Freq: Once | ORAL | Status: DC | PRN

## 2018-11-09 MED ORDER — FENTANYL CITRATE (PF) 100 MCG/2ML IJ SOLN
50.0000 ug | INTRAMUSCULAR | Status: AC | PRN
  Administered 2018-11-09 (×2): 50 ug via INTRAVENOUS
  Administered 2018-11-09: 100 ug via INTRAVENOUS
  Administered 2018-11-09: 50 ug via INTRAVENOUS

## 2018-11-09 MED ORDER — ROCURONIUM BROMIDE 10 MG/ML (PF) SYRINGE
PREFILLED_SYRINGE | INTRAVENOUS | Status: AC
  Filled 2018-11-09: qty 10

## 2018-11-09 MED ORDER — PROPOFOL 10 MG/ML IV BOLUS
INTRAVENOUS | Status: DC | PRN
  Administered 2018-11-09: 200 mg via INTRAVENOUS

## 2018-11-09 MED ORDER — BUPIVACAINE-EPINEPHRINE 0.25% -1:200000 IJ SOLN
INTRAMUSCULAR | Status: DC | PRN
  Administered 2018-11-09: 30 mL

## 2018-11-09 MED ORDER — SUCCINYLCHOLINE CHLORIDE 200 MG/10ML IV SOSY
PREFILLED_SYRINGE | INTRAVENOUS | Status: AC
  Filled 2018-11-09: qty 10

## 2018-11-09 MED ORDER — LIDOCAINE HCL (CARDIAC) PF 100 MG/5ML IV SOSY
PREFILLED_SYRINGE | INTRAVENOUS | Status: DC | PRN
  Administered 2018-11-09: 60 mg via INTRAVENOUS

## 2018-11-09 MED ORDER — ROCURONIUM BROMIDE 100 MG/10ML IV SOLN
INTRAVENOUS | Status: DC | PRN
  Administered 2018-11-09: 50 mg via INTRAVENOUS

## 2018-11-09 MED ORDER — CIPROFLOXACIN IN D5W 400 MG/200ML IV SOLN
INTRAVENOUS | Status: AC
  Filled 2018-11-09: qty 200

## 2018-11-09 MED ORDER — SODIUM CHLORIDE 0.9% FLUSH: 3.0000 mL | INTRAVENOUS | Status: DC | PRN

## 2018-11-09 MED ORDER — ONDANSETRON HCL 4 MG/2ML IJ SOLN
INTRAMUSCULAR | Status: DC | PRN
  Administered 2018-11-09: 4 mg via INTRAVENOUS

## 2018-11-09 MED ORDER — MIDAZOLAM HCL 2 MG/2ML IJ SOLN
1.0000 mg | INTRAMUSCULAR | Status: DC | PRN
  Administered 2018-11-09: 2 mg via INTRAVENOUS

## 2018-11-09 MED ORDER — DEXAMETHASONE SODIUM PHOSPHATE 4 MG/ML IJ SOLN
INTRAMUSCULAR | Status: DC | PRN
  Administered 2018-11-09: 10 mg via INTRAVENOUS

## 2018-11-09 MED ORDER — SODIUM CHLORIDE 0.9% FLUSH: 3.0000 mL | Freq: Two times a day (BID) | INTRAVENOUS | Status: DC

## 2018-11-09 MED ORDER — ACETAMINOPHEN 650 MG RE SUPP: 650.0000 mg | RECTAL | Status: DC | PRN

## 2018-11-09 SURGICAL SUPPLY — 71 items
ADH SKN CLS APL DERMABOND .7 (GAUZE/BANDAGES/DRESSINGS) ×3
APL PRP STRL LF DISP 70% ISPRP (MISCELLANEOUS) ×2
BAG DECANTER FOR FLEXI CONT (MISCELLANEOUS) ×3 IMPLANT
BINDER BREAST LRG (GAUZE/BANDAGES/DRESSINGS) IMPLANT
BINDER BREAST MEDIUM (GAUZE/BANDAGES/DRESSINGS) IMPLANT
BINDER BREAST XLRG (GAUZE/BANDAGES/DRESSINGS) IMPLANT
BINDER BREAST XXLRG (GAUZE/BANDAGES/DRESSINGS) ×2 IMPLANT
BIOPATCH RED 1 DISK 7.0 (GAUZE/BANDAGES/DRESSINGS) IMPLANT
BIOPATCH RED 1IN DISK 7.0MM (GAUZE/BANDAGES/DRESSINGS)
BLADE HEX COATED 2.75 (ELECTRODE) ×3 IMPLANT
BLADE KNIFE PERSONA 10 (BLADE) ×6 IMPLANT
BLADE SURG 15 STRL LF DISP TIS (BLADE) IMPLANT
BLADE SURG 15 STRL SS (BLADE)
BNDG GAUZE ELAST 4 BULKY (GAUZE/BANDAGES/DRESSINGS) ×6 IMPLANT
CANISTER SUCT 1200ML W/VALVE (MISCELLANEOUS) ×3 IMPLANT
CHLORAPREP W/TINT 26 (MISCELLANEOUS) ×5 IMPLANT
COVER BACK TABLE REUSABLE LG (DRAPES) ×3 IMPLANT
COVER MAYO STAND REUSABLE (DRAPES) ×3 IMPLANT
COVER WAND RF STERILE (DRAPES) IMPLANT
DECANTER SPIKE VIAL GLASS SM (MISCELLANEOUS) IMPLANT
DERMABOND ADVANCED (GAUZE/BANDAGES/DRESSINGS) ×6
DERMABOND ADVANCED .7 DNX12 (GAUZE/BANDAGES/DRESSINGS) IMPLANT
DRAIN CHANNEL 19F RND (DRAIN) IMPLANT
DRAPE LAPAROSCOPIC ABDOMINAL (DRAPES) ×3 IMPLANT
DRSG PAD ABDOMINAL 8X10 ST (GAUZE/BANDAGES/DRESSINGS) ×6 IMPLANT
ELECT BLADE 4.0 EZ CLEAN MEGAD (MISCELLANEOUS)
ELECT REM PT RETURN 9FT ADLT (ELECTROSURGICAL) ×3
ELECTRODE BLDE 4.0 EZ CLN MEGD (MISCELLANEOUS) IMPLANT
ELECTRODE REM PT RTRN 9FT ADLT (ELECTROSURGICAL) ×1 IMPLANT
EVACUATOR SILICONE 100CC (DRAIN) IMPLANT
GAUZE SPONGE 4X4 12PLY STRL LF (GAUZE/BANDAGES/DRESSINGS) ×3 IMPLANT
GLOVE BIO SURGEON STRL SZ 6.5 (GLOVE) ×7 IMPLANT
GLOVE BIO SURGEONS STRL SZ 6.5 (GLOVE) ×4
GLOVE BIOGEL PI IND STRL 7.0 (GLOVE) IMPLANT
GLOVE BIOGEL PI INDICATOR 7.0 (GLOVE) ×4
GOWN STRL REUS W/ TWL LRG LVL3 (GOWN DISPOSABLE) ×3 IMPLANT
GOWN STRL REUS W/TWL LRG LVL3 (GOWN DISPOSABLE) ×18
NDL HYPO 25X1 1.5 SAFETY (NEEDLE) ×1 IMPLANT
NDL SAFETY ECLIPSE 18X1.5 (NEEDLE) IMPLANT
NDL SPNL 18GX3.5 QUINCKE PK (NEEDLE) IMPLANT
NEEDLE HYPO 18GX1.5 SHARP (NEEDLE) ×3
NEEDLE HYPO 25X1 1.5 SAFETY (NEEDLE) IMPLANT
NEEDLE SPNL 18GX3.5 QUINCKE PK (NEEDLE) ×3 IMPLANT
NS IRRIG 1000ML POUR BTL (IV SOLUTION) IMPLANT
PACK BASIN DAY SURGERY FS (CUSTOM PROCEDURE TRAY) ×3 IMPLANT
PAD ALCOHOL SWAB (MISCELLANEOUS) IMPLANT
PENCIL BUTTON HOLSTER BLD 10FT (ELECTRODE) ×3 IMPLANT
PIN SAFETY STERILE (MISCELLANEOUS) IMPLANT
SLEEVE SCD COMPRESS KNEE MED (MISCELLANEOUS) ×3 IMPLANT
SPONGE LAP 18X18 RF (DISPOSABLE) ×6 IMPLANT
STRIP SUTURE WOUND CLOSURE 1/2 (SUTURE) ×6 IMPLANT
SUT MNCRL AB 4-0 PS2 18 (SUTURE) ×10 IMPLANT
SUT MON AB 3-0 SH 27 (SUTURE) ×9
SUT MON AB 3-0 SH27 (SUTURE) ×1 IMPLANT
SUT MON AB 5-0 PS2 18 (SUTURE) ×14 IMPLANT
SUT PDS 3-0 CT2 (SUTURE)
SUT PDS AB 2-0 CT2 27 (SUTURE) IMPLANT
SUT PDS II 3-0 CT2 27 ABS (SUTURE) IMPLANT
SUT SILK 3 0 PS 1 (SUTURE) IMPLANT
SYR 3ML 23GX1 SAFETY (SYRINGE) ×3 IMPLANT
SYR 50ML LL SCALE MARK (SYRINGE) ×2 IMPLANT
SYR BULB IRRIGATION 50ML (SYRINGE) ×3 IMPLANT
SYR CONTROL 10ML LL (SYRINGE) ×1 IMPLANT
TAPE MEASURE VINYL STERILE (MISCELLANEOUS) ×3 IMPLANT
TOWEL GREEN STERILE FF (TOWEL DISPOSABLE) ×6 IMPLANT
TUBE CONNECTING 20'X1/4 (TUBING) ×1
TUBE CONNECTING 20X1/4 (TUBING) ×2 IMPLANT
TUBING INFILTRATION IT-10001 (TUBING) IMPLANT
TUBING SET GRADUATE ASPIR 12FT (MISCELLANEOUS) IMPLANT
UNDERPAD 30X30 (UNDERPADS AND DIAPERS) ×6 IMPLANT
YANKAUER SUCT BULB TIP NO VENT (SUCTIONS) ×3 IMPLANT

## 2018-11-09 NOTE — Transfer of Care (Signed)
Immediate Anesthesia Transfer of Care Note  Patient: Adriana Wright  Procedure(s) Performed: MAMMARY REDUCTION  (BREAST) (Bilateral Breast)  Patient Location: PACU  Anesthesia Type:General  Level of Consciousness: awake, alert , oriented and patient cooperative  Airway & Oxygen Therapy: Patient Spontanous Breathing and Patient connected to face mask oxygen  Post-op Assessment: Report given to RN and Post -op Vital signs reviewed and stable  Post vital signs: Reviewed and stable  Last Vitals:  Vitals Value Taken Time  BP    Temp    Pulse 113 11/09/2018  3:32 PM  Resp    SpO2 96 % 11/09/2018  3:32 PM  Vitals shown include unvalidated device data.  Last Pain:  Vitals:   11/09/18 1109  TempSrc: Oral  PainSc: 0-No pain         Complications: No apparent anesthesia complications

## 2018-11-09 NOTE — Interval H&P Note (Signed)
History and Physical Interval Note:  11/09/2018 11:45 AM  Adriana Wright  has presented today for surgery, with the diagnosis of Mammary hypertrophy, macromastia.  The various methods of treatment have been discussed with the patient and family. After consideration of risks, benefits and other options for treatment, the patient has consented to  Procedure(s) with comments: MAMMARY REDUCTION  (BREAST) (Bilateral) - 3.5 hours, please as a surgical intervention.  The patient's history has been reviewed, patient examined, no change in status, stable for surgery.  I have reviewed the patient's chart and labs.  Questions were answered to the patient's satisfaction.     Alena Bills Dillingham

## 2018-11-09 NOTE — Op Note (Addendum)
Breast Reduction Op note:    DATE OF PROCEDURE: 11/09/2018  LOCATION: [facility]  SURGEON: Alan Ripper Sanger Dillingham, DO  ASSISTANT: Enedina Finner, RNFA  PREOPERATIVE DIAGNOSIS 1. Macromastia 2. Neck Pain 3. Back Pain  POSTOPERATIVE DIAGNOSIS 1. Macromastia 2. Neck Pain 3. Back Pain  PROCEDURES 1. Bilateral breast reduction.  Right reduction 7346g, Left reduction 737g  COMPLICATIONS: None.  DRAINS: none  INDICATIONS FOR PROCEDURE Adriana Wright is a 18 y.o. year-old female born on 02-Sep-2000,with a history of symptomatic macromastia with concominant back pain, neck pain, shoulder grooving from her bra.   MRN: [id number]  CONSENT Informed consent was obtained directly from the patient. The risks, benefits and alternatives were fully discussed. Specific risks including but not limited to bleeding, infection, hematoma, seroma, scarring, pain, nipple necrosis, asymmetry, poor cosmetic results, and need for further surgery were discussed. The patient had ample opportunity to have her questions answered to her satisfaction.  DESCRIPTION OF PROCEDURE  Patient was brought into the operating room and placed in a supine position.  SCDs were placed and appropriate padding was performed.  Antibiotics were given. The patient underwent general anesthesia and the chest was prepped and draped in a sterile fashion.  A timeout was performed and all information was confirmed to be correct.  Right side: Preoperative markings were confirmed.  Incision lines were injected with 1% Xylocaine with epinephrine.  After waiting for vasoconstriction, the marked lines were incised.  A Wise-pattern superomedial breast reduction was performed by de-epithelializing the pedicle, using bovie to create the superomedial pedicle, and removing breast tissue from the superior, lateral, and inferior portions of the breast.  Care was taken to not undermine the breast pedicle. Hemostasis was achieved.   The nipple was gently rotated into position and the soft tissue closed with 4-0 Monocryl.   The pocket was irrigated and hemostasis confirmed.  The deep tissues were approximated with 3-0 monocryl sutures and the skin was closed with deep dermal and subcuticular 4-0 Monocryl sutures.  The nipple and skin flaps had good capillary refill at the end of the procedure.    Left side: Preoperative markings were confirmed.  Incision lines were injected with 1% Xylocaine with epinephrine.  After waiting for vasoconstriction, the marked lines were incised.  A Wise-pattern superomedial breast reduction was performed by de-epithelializing the pedicle, using bovie to create the superomedial pedicle, and removing breast tissue from the superior, lateral, and inferior portions of the breast.  Care was taken to not undermine the breast pedicle. Hemostasis was achieved.  The nipple was gently rotated into position and the soft tissue was closed with 4-0 Monocryl.  The patient was sat upright and size and shape symmetry was confirmed.  The pocket was irrigated and hemostasis confirmed.  The deep tissues were approximated with 3-0 monocryl sutures and the skin was closed with deep dermal and subcuticular 4-0 Monocryl sutures.  Dermabond was applied.  A breast binder and ABDs were placed.  The nipple and skin flaps had good capillary refill at the end of the procedure.  The patient tolerated the procedure well. The patient was allowed to wake from anesthesia and taken to the recovery room in satisfactory condition  The RNFA assisted throughout the case.  The RNFA was essential in retraction and counter traction when needed to make the case progress smoothly.  This retraction and assistance made it possible to see the tissue plans for the procedure.  The assistance was needed for blood control, tissue re-approximation and  assisted with closure of the incision site.

## 2018-11-09 NOTE — Discharge Instructions (Signed)
INSTRUCTIONS FOR AFTER SURGERY   You are having surgery.  You will likely have some questions about what to expect following your operation.  The following information will help you and your family understand what to expect when you are discharged from the hospital.  Following these guidelines will help ensure a smooth recovery and reduce risks of complications.   Postoperative instructions include information on: diet, wound care, medications and physical activity.  AFTER SURGERY Expect to go home after the procedure.  In some cases, you may need to spend one night in the hospital for observation.  DIET This surgery does not require a specific diet.  However, I have to mention that the healthier you eat the better your body can start healing. It is important to increasing your protein intake.  This means limiting the foods with sugar and carbohydrates.  Focus on vegetables and some meat.  If you have any liposuction during your procedure be sure to drink water.  If your urine is bright yellow, then it is concentrated, and you need to drink more water.  As a general rule after surgery, you should have 8 ounces of water every hour while awake.  If you find you are persistently nauseated or unable to take in liquids let us know.  NO TOBACCO USE or EXPOSURE.  This will slow your healing process and increase the risk of a wound.  WOUND CARE You can shower the day after surgery.  Use fragrance free soap.  Dial, Dove and [place] are usually mild on the skin. Leave the steri-strips / tape directly attached to your skin. It is OK to get these wet.  No baths, pools or hot tubs for two weeks. We close your incision to leave the smallest and best-looking scar. No ointment or creams on your incisions until given the go ahead.  Especially not Neosporin (Too many skin reactions with this one).  A few weeks after surgery you can use Mederma and start massaging the scar. We ask you to wear your binder or sports bra for  the first 6 weeks around the clock, including while sleeping. This provides added comfort and helps reduce the fluid accumulation at the surgery site.  ACTIVITY No heavy lifting until cleared by the doctor.  It is OK to walk and climb stairs. In fact, moving your legs is very important to decrease your risk of a blood clot.  It will also help keep you from getting deconditioned.  Every 1 to 2 hours get up and walk for 5 minutes. This will help with a quicker recovery back to normal.  Let pain be your guide so you don't do too much.  NO, you cannot do the spring cleaning and don't plan on taking care of anyone else.  This is your time for TLC.  You will be more comfortable if you sleep and rest with your head elevated either with a few pillows under you or in a recliner.  No stomach sleeping for a few months.  WORK Everyone returns to work at different times. As a rough guide, most people take at least 1 - 2 weeks off prior to returning to work. If you need documentation for your job, bring the forms to your postoperative follow up visit.  DRIVING Arrange for someone to bring you home from the hospital.  You may be able to drive a few days after surgery but not while taking any narcotics or valium.  BOWEL MOVEMENTS Constipation can occur after anesthesia  and while taking pain medication.  It is important to stay ahead for your comfort.  We recommend taking Milk of Magnesia (2 tablespoons; twice a day) while taking the pain pills.  SEROMA This is fluid your body tried to put in the surgical site.  This is normal but if it creates tight skinny skin let us know.  It usually decreases in a few weeks.  WHEN TO CALL Call your surgeon's office if any of the following occur:  Fever 101 degrees F or greater  Excessive bleeding or fluid from the incision site.  Pain that increases over time without aid from the medications  Redness, warmth, or pus draining from incision sites  Persistent nausea or  inability to take in liquids  Severe misshapen area that underwent the operation.    Post Anesthesia Home Care Instructions  Activity: Get plenty of rest for the remainder of the day. A responsible individual must stay with you for 24 hours following the procedure.  For the next 24 hours, DO NOT: -Drive a car -Advertising copywriterperate machinery -Drink alcoholic beverages -Take any medication unless instructed by your physician -Make any legal decisions or sign important papers.  Meals: Start with liquid foods such as gelatin or soup. Progress to regular foods as tolerated. Avoid greasy, spicy, heavy foods. If nausea and/or vomiting occur, drink only clear liquids until the nausea and/or vomiting subsides. Call your physician if vomiting continues.  Special Instructions/Symptoms: Your throat may feel dry or sore from the anesthesia or the breathing tube placed in your throat during surgery. If this causes discomfort, gargle with warm salt water. The discomfort should disappear within 24 hours.  If you had a scopolamine patch placed behind your ear for the management of post- operative nausea and/or vomiting:  1. The medication in the patch is effective for 72 hours, after which it should be removed.  Wrap patch in a tissue and discard in the trash. Wash hands thoroughly with soap and water. 2. You may remove the patch earlier than 72 hours if you experience unpleasant side effects which may include dry mouth, dizziness or visual disturbances. 3. Avoid touching the patch. Wash your hands with soap and water after contact with the patch.

## 2018-11-09 NOTE — Anesthesia Procedure Notes (Signed)
Procedure Name: Intubation Date/Time: 11/09/2018 12:35 PM Performed by: Signe Colt, CRNA Pre-anesthesia Checklist: Patient identified, Emergency Drugs available, Suction available and Patient being monitored Patient Re-evaluated:Patient Re-evaluated prior to induction Oxygen Delivery Method: Circle system utilized Preoxygenation: Pre-oxygenation with 100% oxygen Induction Type: IV induction Ventilation: Mask ventilation without difficulty Laryngoscope Size: Mac and 3 Grade View: Grade I Tube type: Oral Tube size: 7.0 mm Number of attempts: 1 Airway Equipment and Method: Stylet and Oral airway Placement Confirmation: ETT inserted through vocal cords under direct vision,  positive ETCO2 and breath sounds checked- equal and bilateral Secured at: 21 cm Tube secured with: Tape Dental Injury: Teeth and Oropharynx as per pre-operative assessment

## 2018-11-09 NOTE — Anesthesia Postprocedure Evaluation (Signed)
Anesthesia Post Note  Patient: Adriana Wright  Procedure(s) Performed: MAMMARY REDUCTION  (BREAST) (Bilateral Breast)     Patient location during evaluation: PACU Anesthesia Type: General Level of consciousness: sedated Pain management: pain level controlled Vital Signs Assessment: post-procedure vital signs reviewed and stable Respiratory status: spontaneous breathing and respiratory function stable Cardiovascular status: stable Postop Assessment: no apparent nausea or vomiting Anesthetic complications: no    Last Vitals:  Vitals:   11/09/18 1615 11/09/18 1630  BP: 113/79 113/80  Pulse: 79 86  Resp: 16 17  Temp:    SpO2: 98% 99%    Last Pain:  Vitals:   11/09/18 1630  TempSrc:   PainSc: 2                  Blaize Nipper DANIEL

## 2018-11-09 NOTE — Anesthesia Preprocedure Evaluation (Addendum)
Anesthesia Evaluation  Patient identified by MRN, date of birth, ID band Patient awake    Reviewed: Allergy & Precautions, NPO status , Patient's Chart, lab work & pertinent test results  History of Anesthesia Complications Negative for: history of anesthetic complications  Airway Mallampati: I  TM Distance: >3 FB Neck ROM: Full    Dental no notable dental hx. (+) Teeth Intact   Pulmonary asthma ,    Pulmonary exam normal        Cardiovascular negative cardio ROS Normal cardiovascular exam     Neuro/Psych negative neurological ROS  negative psych ROS   GI/Hepatic negative GI ROS, Neg liver ROS,   Endo/Other  negative endocrine ROS  Renal/GU negative Renal ROS  negative genitourinary   Musculoskeletal negative musculoskeletal ROS (+)   Abdominal   Peds  Hematology negative hematology ROS (+)   Anesthesia Other Findings   Reproductive/Obstetrics                            Anesthesia Physical Anesthesia Plan  ASA: II  Anesthesia Plan: General   Post-op Pain Management:    Induction: Intravenous  PONV Risk Score and Plan: 3 and Treatment may vary due to age or medical condition, Ondansetron, Dexamethasone and Midazolam  Airway Management Planned: Oral ETT  Additional Equipment: None  Intra-op Plan:   Post-operative Plan: Extubation in OR  Informed Consent: I have reviewed the patients History and Physical, chart, labs and discussed the procedure including the risks, benefits and alternatives for the proposed anesthesia with the patient or authorized representative who has indicated his/her understanding and acceptance.     Dental advisory given  Plan Discussed with:   Anesthesia Plan Comments:        Anesthesia Quick Evaluation

## 2018-11-10 ENCOUNTER — Encounter (HOSPITAL_BASED_OUTPATIENT_CLINIC_OR_DEPARTMENT_OTHER): Payer: Self-pay | Admitting: Plastic Surgery

## 2018-11-16 ENCOUNTER — Telehealth: Payer: Self-pay | Admitting: Plastic Surgery

## 2018-11-16 NOTE — Telephone Encounter (Signed)

## 2018-11-17 ENCOUNTER — Other Ambulatory Visit: Payer: Self-pay

## 2018-11-17 ENCOUNTER — Encounter: Payer: Self-pay | Admitting: Plastic Surgery

## 2018-11-17 ENCOUNTER — Encounter: Payer: Medicaid Other | Admitting: Plastic Surgery

## 2018-11-17 ENCOUNTER — Ambulatory Visit (INDEPENDENT_AMBULATORY_CARE_PROVIDER_SITE_OTHER): Payer: Medicaid Other | Admitting: Plastic Surgery

## 2018-11-17 VITALS — BP 109/76 | HR 94 | Temp 98.4°F | Ht 63.0 in | Wt 193.8 lb

## 2018-11-17 DIAGNOSIS — N62 Hypertrophy of breast: Secondary | ICD-10-CM

## 2018-11-17 NOTE — Progress Notes (Signed)
The patient is a 18 year old female here with her mom for follow-up on her breast reduction.  She still has significant swelling.  It does not seem like she has fluid collection.  She did get a little bit of blistering from the Steri-Strip on the left lateral side.  This seems to have calmed down and I do not see any other areas of concern.  The incisions appear to be healing very nicely.  I would like to see her back in 2 weeks.

## 2018-12-01 ENCOUNTER — Ambulatory Visit: Payer: Medicaid Other | Admitting: Plastic Surgery

## 2018-12-08 ENCOUNTER — Telehealth: Payer: Self-pay | Admitting: Plastic Surgery

## 2018-12-08 NOTE — Telephone Encounter (Signed)

## 2018-12-09 ENCOUNTER — Ambulatory Visit (INDEPENDENT_AMBULATORY_CARE_PROVIDER_SITE_OTHER): Payer: Medicaid Other | Admitting: Plastic Surgery

## 2018-12-09 ENCOUNTER — Other Ambulatory Visit: Payer: Self-pay

## 2018-12-09 ENCOUNTER — Encounter: Payer: Self-pay | Admitting: Surgical

## 2018-12-09 VITALS — BP 116/76 | HR 88 | Temp 98.4°F | Ht 63.0 in | Wt 191.6 lb

## 2018-12-09 DIAGNOSIS — N62 Hypertrophy of breast: Secondary | ICD-10-CM

## 2018-12-09 NOTE — Progress Notes (Signed)
The patient is a 18 year old female here with her mom for follow-up on her bilateral breast reduction.  She is very pleased with her progress.  I removed a few sutures today around the nipple areola.  The majority of the Steri-Strips at the vertical limb are still in place.  The breast is soft.  There is no sign of hematoma or seroma.  Recommend massage to the breast on any area that seems a little bit firm.  She can start using Mederma when the Steri-Strips come off.  She can go into a regular bra without a wire and then occasionally with a wire in a month.  Follow up in 3 months.

## 2018-12-10 ENCOUNTER — Encounter: Payer: Medicaid Other | Admitting: Plastic Surgery

## 2018-12-15 ENCOUNTER — Encounter: Payer: Medicaid Other | Admitting: Plastic Surgery

## 2018-12-22 ENCOUNTER — Encounter: Payer: Self-pay | Admitting: Plastic Surgery

## 2019-02-10 ENCOUNTER — Telehealth: Payer: Self-pay

## 2019-02-10 NOTE — Telephone Encounter (Signed)
[facility] called to verify that patient was treated for what medication was used to treat for positive chlamydia. Reported patient was treated with azithromycin.

## 2019-03-08 DIAGNOSIS — W1841XA Slipping, tripping and stumbling without falling due to stepping on object, initial encounter: Secondary | ICD-10-CM | POA: Diagnosis not present

## 2019-03-08 DIAGNOSIS — S93492A Sprain of other ligament of left ankle, initial encounter: Secondary | ICD-10-CM | POA: Diagnosis not present

## 2019-03-08 DIAGNOSIS — S93491A Sprain of other ligament of right ankle, initial encounter: Secondary | ICD-10-CM | POA: Diagnosis not present

## 2019-03-08 DIAGNOSIS — M25572 Pain in left ankle and joints of left foot: Secondary | ICD-10-CM | POA: Diagnosis not present

## 2019-03-08 DIAGNOSIS — M25571 Pain in right ankle and joints of right foot: Secondary | ICD-10-CM | POA: Diagnosis not present

## 2019-03-19 ENCOUNTER — Ambulatory Visit: Payer: Medicaid Other | Admitting: Plastic Surgery

## 2019-03-26 ENCOUNTER — Other Ambulatory Visit: Payer: Self-pay

## 2019-03-26 ENCOUNTER — Ambulatory Visit (INDEPENDENT_AMBULATORY_CARE_PROVIDER_SITE_OTHER): Payer: Medicaid Other | Admitting: Plastic Surgery

## 2019-03-26 ENCOUNTER — Encounter: Payer: Self-pay | Admitting: Plastic Surgery

## 2019-03-26 VITALS — BP 111/69 | HR 57 | Temp 98.2°F | Ht 63.0 in | Wt 193.0 lb

## 2019-03-26 DIAGNOSIS — N62 Hypertrophy of breast: Secondary | ICD-10-CM | POA: Diagnosis not present

## 2019-03-26 NOTE — Progress Notes (Signed)
   Subjective:    Patient ID: Adriana Wright, female    DOB: 02/07/2001, 18 y.o.   MRN: [id number]  Patient is a 18 year old female here for follow-up on her breast reduction.  Overall she is doing extremely well.  She has had a decrease in her neck and her back pain.  She is pleased with her results.  Her symmetry is good from the anterior-posterior direction.  She has a little bit of scarring on the left lateral inferior portion of the breast that looks like it may be some scar contracture or fat necrosis.  She is not bothered by it right now.  He does not cause any pain.     Review of Systems  Constitutional: Negative.   HENT: Negative.  Negative for congestion.   Eyes: Negative.   Respiratory: Negative for chest tightness and shortness of breath.   Cardiovascular: Negative.   Gastrointestinal: Negative.   Endocrine: Negative.   Genitourinary: Negative.   Musculoskeletal: Negative.   Neurological: Negative.   Hematological: Negative.   Psychiatric/Behavioral: Negative.        Objective:   Physical Exam Vitals signs and nursing note reviewed.  Constitutional:      Appearance: Normal appearance.  Neck:     Musculoskeletal: Normal range of motion.  Cardiovascular:     Rate and Rhythm: Normal rate.     Pulses: Normal pulses.  Pulmonary:     Effort: Pulmonary effort is normal. No respiratory distress.  Abdominal:     General: Abdomen is flat. There is no distension.  Neurological:     General: No focal deficit present.     Mental Status: She is alert and oriented to person, place, and time.  Psychiatric:        Mood and Affect: Mood normal.        Behavior: Behavior normal.        Thought Content: Thought content normal.        Assessment & Plan:     ICD-10-CM   1. Symptomatic mammary hypertrophy  N62     I would like to give the left breast a little more time to settle.  If there is still some asymmetry we may be able to do scar release or removal of fat  necrosis. Pictures were obtained of the patient and placed in the chart with the patient's or guardian's permission.

## 2019-04-25 ENCOUNTER — Ambulatory Visit (INDEPENDENT_AMBULATORY_CARE_PROVIDER_SITE_OTHER)
Admission: RE | Admit: 2019-04-25 | Discharge: 2019-04-25 | Disposition: A | Discharge status: Home / Self Care | Payer: Medicaid Other | Source: Ambulatory Visit

## 2019-04-25 DIAGNOSIS — R21 Rash and other nonspecific skin eruption: Secondary | ICD-10-CM | POA: Diagnosis not present

## 2019-04-25 MED ORDER — PREDNISONE 50 MG PO TABS: 50.0000 mg | ORAL_TABLET | Freq: Every day | ORAL | 0 refills | Status: DC

## 2019-04-25 MED ORDER — CETIRIZINE HCL 10 MG PO TABS: 10.0000 mg | ORAL_TABLET | Freq: Every day | ORAL | 0 refills | Status: AC

## 2019-04-25 NOTE — Discharge Instructions (Signed)
Start prednisone and zyrtec as directed. Ice compress to help with itching. Monitor for any new exposures that could be causing symptoms. Follow up with PCP for reevaluation if symptoms not improving. If noticing signs on infection such as spreading redness, pain, drainage, follow up for in person evaluation.

## 2019-04-25 NOTE — ED Provider Notes (Signed)
Virtual Visit via Video Note:  Adriana Wright  initiated request for Telemedicine visit with [facility] team. I connected with Adriana Wright  on 04/25/2019 at 1:34 PM  for a synchronized telemedicine visit using a video enabled HIPPA compliant telemedicine application. I verified that I am speaking with Adriana Wright  using two identifiers. Amy Doree Fudge, PA-C  was physically located in a [facility] Urgent care site and JOCIE MERONEY was located at a different location.   The limitations of evaluation and management by telemedicine as well as the availability of in-person appointments were discussed. Patient was informed that she  may incur a bill ( including co-pay) for this virtual visit encounter. Adriana Wright  expressed understanding and gave verbal consent to proceed with virtual visit.     History of Present Illness:Adriana Wright  is a 18 y.o. female presents with 2 to 3-day history of rash.  First noticed rash to the left back, with itching sensation.  Has now spread throughout the whole body.  Rash is itching and sensation, worse at nighttime.  Has surrounding erythema, but denies any spreading.  Denies pain or warmth.  Denies fever, chills, body aches.  Denies URI symptoms such as cough, congestion, sore throat.  Denies any new hygiene product changes, travels, allergies to food.  Has not had any outdoor activity.  Has not tried anything for the symptoms.  Past Medical History:  Diagnosis Date  . Adenotonsillar hypertrophy 09/2011   no snoring/apnea/waking up coughing or choking, per mother  . Asthma    prn inhaler  . Nasal congestion 10/03/2011  . Seasonal allergies     Allergies  Allergen Reactions  . Penicillins Hives and Diarrhea    Has patient had a PCN reaction causing immediate rash, facial/tongue/throat swelling, SOB or lightheadedness with hypotension: Unknown Has patient had a PCN reaction causing severe rash involving mucus membranes or skin  necrosis: Unknown Has patient had a PCN reaction that required hospitalization: Unknown Has patient had a PCN reaction occurring within the last 10 years: Unknown If all of the above answers are "NO", then may proceed with Cephalosporin use.         Observations/Objective: General: Well appearing, nontoxic, no acute distress. Sitting comfortably. Head: Normocephalic, atraumatic Eye: No conjunctival injection, eyelid swelling. EOMI ENT: Mucus membranes moist, no lip cracking. No obvious nasal drainage. Pulm: Speaking in full sentences without difficulty. Normal effort. No respiratory distress, accessory muscle use. Skin: Exam limited due to video visit.  Maculopapular rash to the abdomen, extremities.  Similar rash to the face/forehead.  No erythema visualized on video, patient denies tenderness to palpation. Neuro: Normal mental status. Alert and oriented x 3.   Assessment and Plan: Given widespread rash with itching, will cover for contact/irritant dermatitis with prednisone.  Other symptomatic treatment discussed.  Patient to continue to monitor, follow-up for in person evaluation if symptoms not improving.  Return precautions given.  Patient expresses understanding and agrees to plan.  Follow Up Instructions:    I discussed the assessment and treatment plan with the patient. The patient was provided an opportunity to ask questions and all were answered. The patient agreed with the plan and demonstrated an understanding of the instructions.   The patient was advised to call back or seek an in-person evaluation if the symptoms worsen or if the condition fails to improve as anticipated.  I provided 15 minutes of non-face-to-face time during this encounter.  Ok Edwards, PA-C  04/25/2019 1:34 PM        Ok Edwards, PA-C 04/25/19 1355

## 2019-07-01 ENCOUNTER — Telehealth: Payer: Self-pay

## 2019-07-01 NOTE — Telephone Encounter (Signed)

## 2019-07-02 ENCOUNTER — Encounter: Payer: Self-pay | Admitting: Plastic Surgery

## 2019-07-02 ENCOUNTER — Other Ambulatory Visit: Payer: Self-pay

## 2019-07-02 ENCOUNTER — Ambulatory Visit (INDEPENDENT_AMBULATORY_CARE_PROVIDER_SITE_OTHER): Payer: Medicaid Other | Admitting: Plastic Surgery

## 2019-07-02 VITALS — BP 115/81 | HR 92 | Temp 97.7°F | Ht 63.0 in | Wt 194.2 lb

## 2019-07-02 DIAGNOSIS — N62 Hypertrophy of breast: Secondary | ICD-10-CM | POA: Diagnosis not present

## 2019-07-02 DIAGNOSIS — N6489 Other specified disorders of breast: Secondary | ICD-10-CM | POA: Diagnosis not present

## 2019-07-02 NOTE — Progress Notes (Signed)
   Subjective:    Patient ID: Adriana Wright, female    DOB: 01-27-2001, 19 y.o.   MRN: [id number]  Patient is a 19 year old female here for follow-up after undergoing bilateral breast reductions in June 2020.  She had over 700 g removed from both breasts.  She is very pleased with her overall results.  She has improvement in her neck and back pain significantly.  She is pleased with her size.  There is some scar contracture of the left breast that is pulling the left nipple laterally.  There is a little excess scar and contracture around the nipple areola both sides.  There is no sign of infection the breast tissue is much softer and has improved a great deal.  Incisions of all healed nicely.     Review of Systems  Constitutional: Negative.   HENT: Negative.   Eyes: Negative.   Respiratory: Negative.   Cardiovascular: Negative.  Negative for leg swelling.  Gastrointestinal: Negative.  Negative for abdominal distention.  Endocrine: Negative.   Genitourinary: Negative.   Musculoskeletal: Negative.  Negative for back pain.  Hematological: Negative.   Psychiatric/Behavioral: Negative.        Objective:   Physical Exam Vitals and nursing note reviewed.  Constitutional:      Appearance: Normal appearance.  HENT:     Head: Normocephalic and atraumatic.  Cardiovascular:     Rate and Rhythm: Normal rate.     Pulses: Normal pulses.  Pulmonary:     Effort: Pulmonary effort is normal.  Abdominal:     General: Abdomen is flat.  Neurological:     General: No focal deficit present.     Mental Status: She is alert and oriented to person, place, and time.  Psychiatric:        Mood and Affect: Mood normal.        Behavior: Behavior normal.        Thought Content: Thought content normal.       Assessment & Plan:     ICD-10-CM   1. Symptomatic mammary hypertrophy  N62   2. Postoperative breast asymmetry  N64.89      Recommend release and excision of left breast lateral scar for  improved symmetry and excision of scar contracture nipple areola bilaterally.  Pictures were obtained of the patient and placed in the chart with the patient's or guardian's permission.   The 21st Century Cures Act was signed into law in 2016 which includes the topic of electronic health records.  This provides immediate access to information in MyChart.  This includes consultation notes, operative notes, office notes, lab results and pathology reports.  If you have any questions about what you read please let us know at your next visit or call us at the office.  We are right here with you.

## 2019-10-21 DIAGNOSIS — Z7189 Other specified counseling: Secondary | ICD-10-CM | POA: Diagnosis not present

## 2019-10-21 DIAGNOSIS — Z202 Contact with and (suspected) exposure to infections with a predominantly sexual mode of transmission: Secondary | ICD-10-CM | POA: Diagnosis not present

## 2019-10-21 DIAGNOSIS — Z7251 High risk heterosexual behavior: Secondary | ICD-10-CM | POA: Diagnosis not present

## 2019-10-21 DIAGNOSIS — Z3202 Encounter for pregnancy test, result negative: Secondary | ICD-10-CM | POA: Diagnosis not present

## 2019-10-21 DIAGNOSIS — Z113 Encounter for screening for infections with a predominantly sexual mode of transmission: Secondary | ICD-10-CM | POA: Diagnosis not present

## 2019-10-21 DIAGNOSIS — Z114 Encounter for screening for human immunodeficiency virus [HIV]: Secondary | ICD-10-CM | POA: Diagnosis not present

## 2020-02-09 DIAGNOSIS — Z03818 Encounter for observation for suspected exposure to other biological agents ruled out: Secondary | ICD-10-CM | POA: Diagnosis not present

## 2020-04-26 DIAGNOSIS — Z03818 Encounter for observation for suspected exposure to other biological agents ruled out: Secondary | ICD-10-CM | POA: Diagnosis not present

## 2020-04-26 DIAGNOSIS — Z20822 Contact with and (suspected) exposure to covid-19: Secondary | ICD-10-CM | POA: Diagnosis not present

## 2020-05-12 IMAGING — US US PELVIS COMPLETE TRANSABD/TRANSVAG
1 series · 14 of 25 positions shown · non-contrast
Comparison: August 07, 2015

CLINICAL DATA: Lower pelvic pain.

EXAM:
TRANSABDOMINAL AND TRANSVAGINAL ULTRASOUND OF PELVIS
TECHNIQUE: Both transabdominal and transvaginal ultrasound examinations of the
pelvis were performed. Transabdominal technique was performed for
global imaging of the pelvis including uterus, ovaries, adnexal
regions, and pelvic cul-de-sac. It was necessary to proceed with
endovaginal exam following the transabdominal exam to visualize the
endometrium and ovaries.

[Series 1: us pelvis complete transabd/transvag · 0.26mm/px · 14 of 54 slices shown]
[im 1/54]
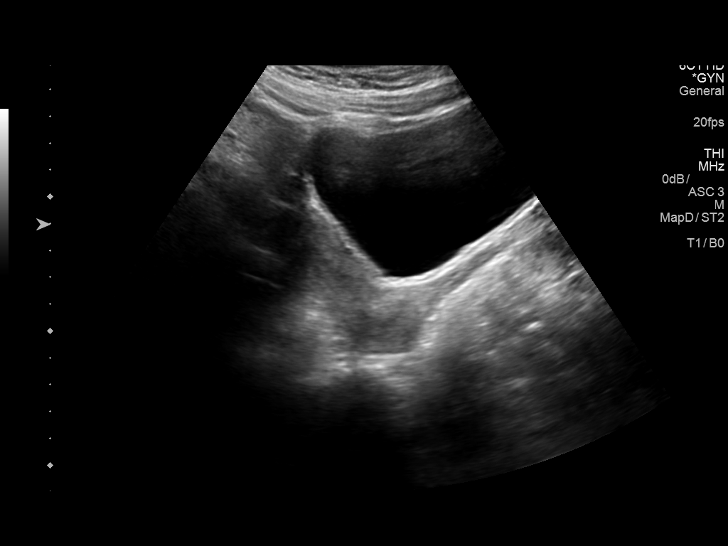
[im 5/54]
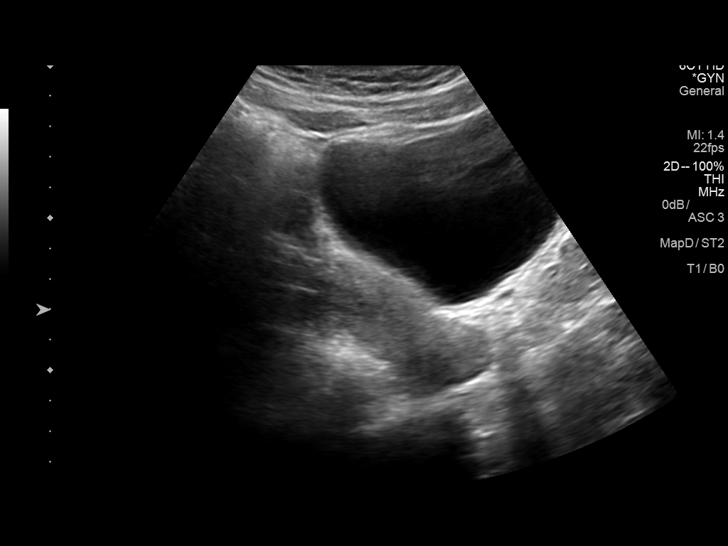
[im 9/54]
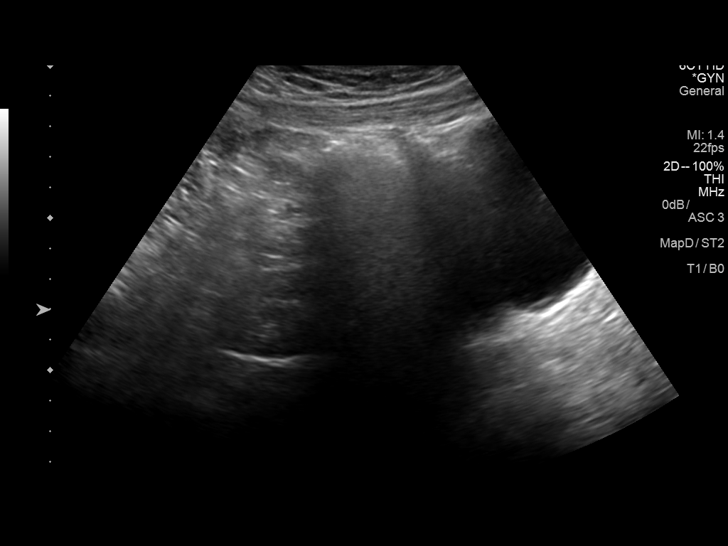
[im 14/54]
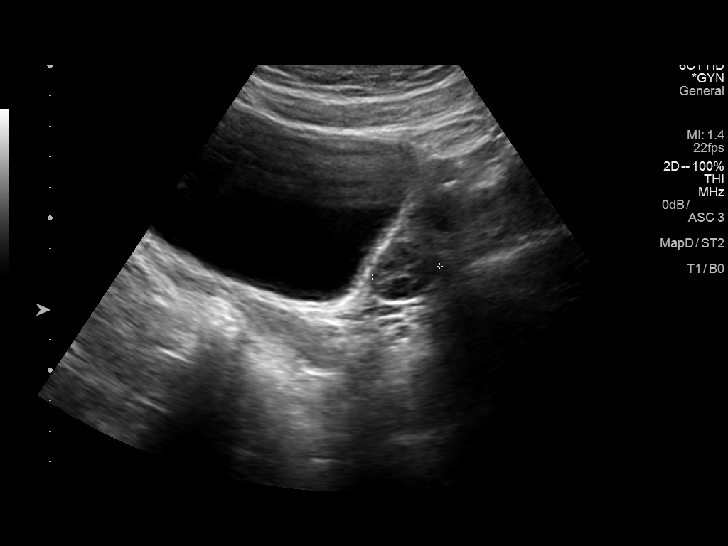
[im 18/54]
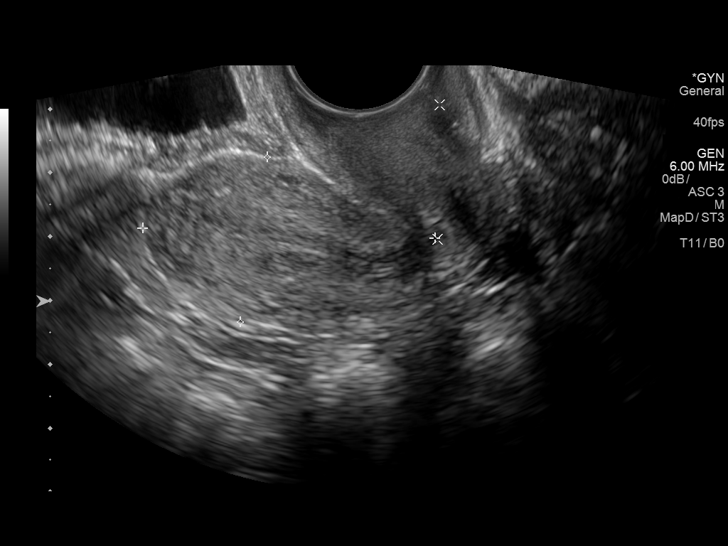
[im 20/54]
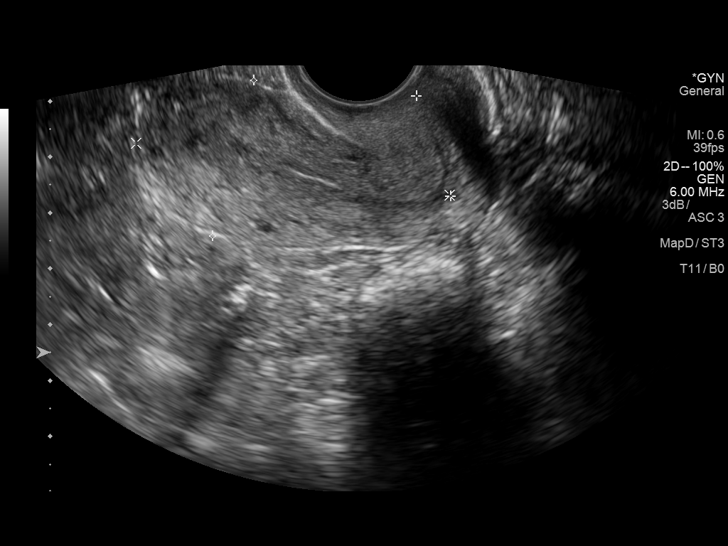
[im 25/54]
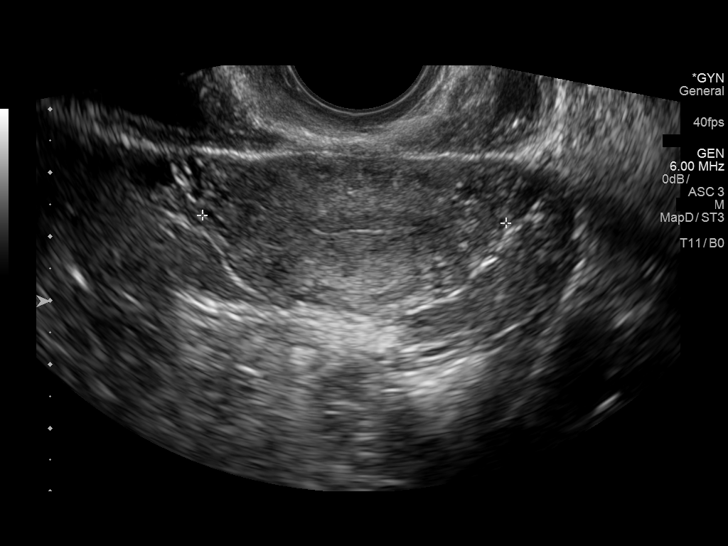
[im 29/54]
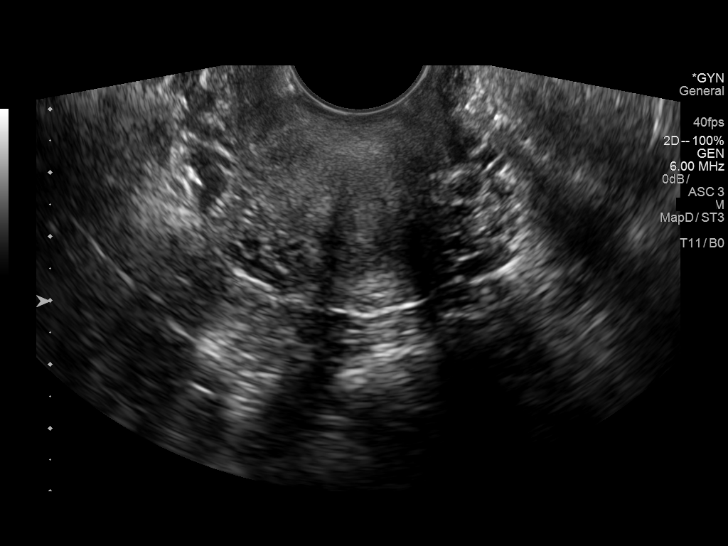
[im 34/54]
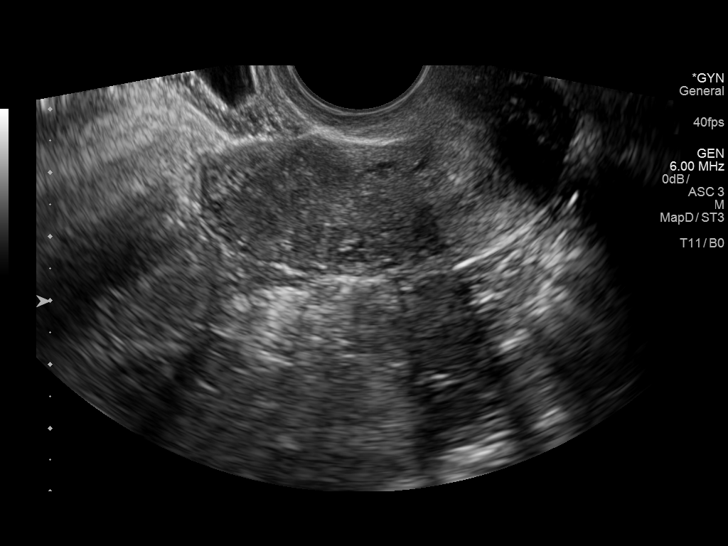
[im 36/54]
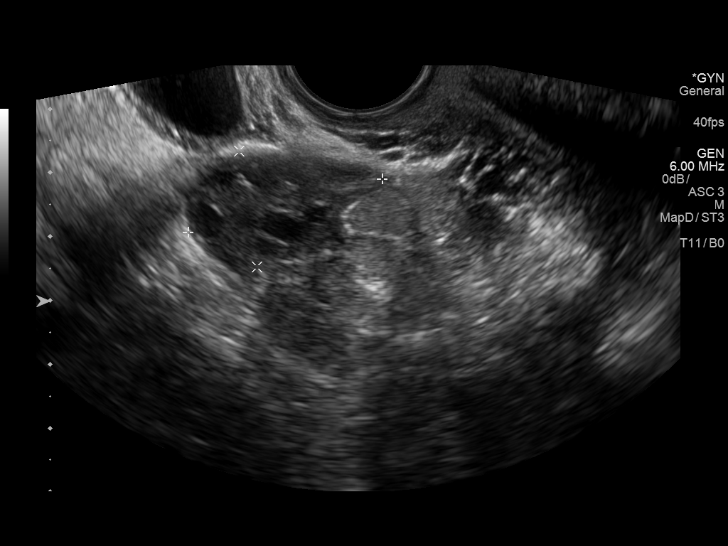
[im 40/54]
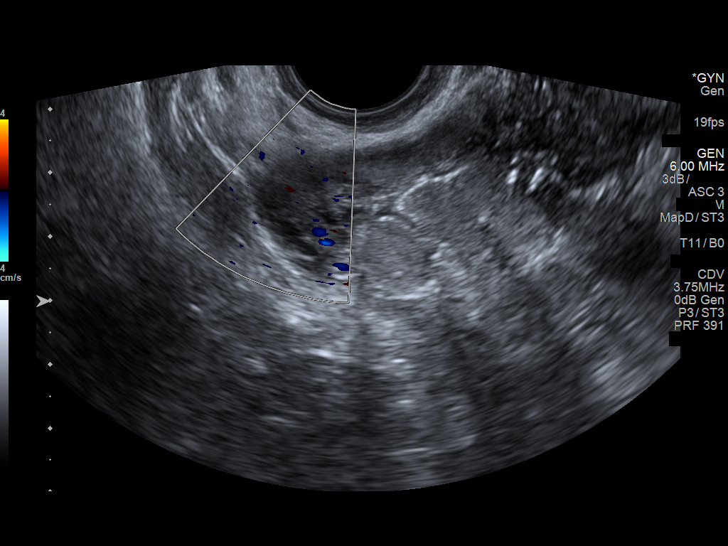
[im 45/54]
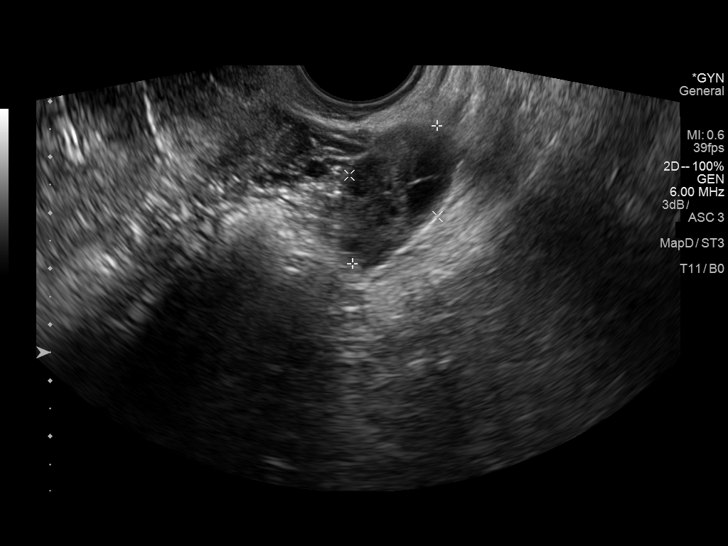
[im 49/54]
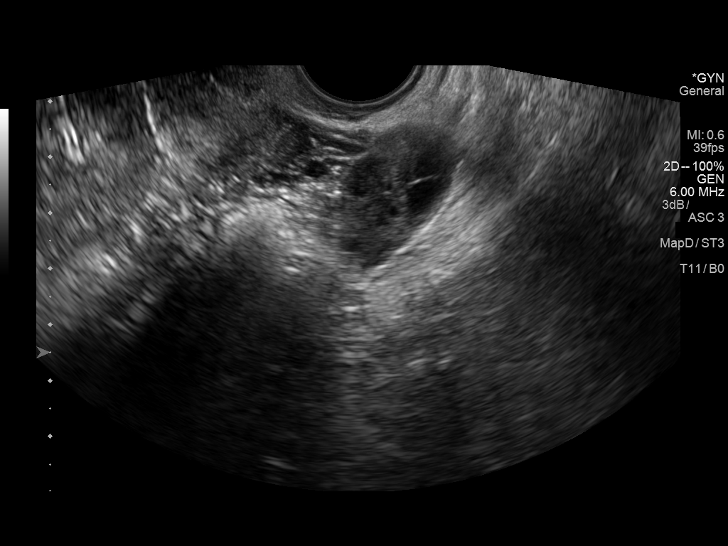
[im 54/54]
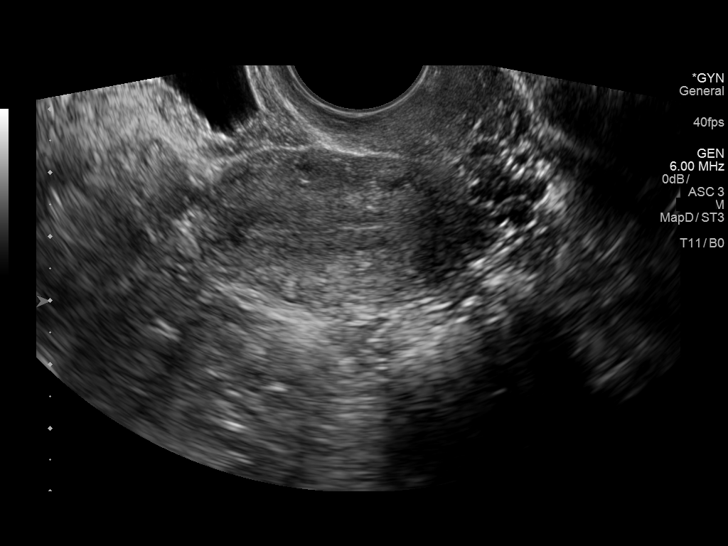

[14 of 25 positions shown; findings below may reference images not displayed]

FINDINGS: Uterus

Measurements: 7.6 x 2.9 x 4.8 cm. No fibroids or other mass
visualized.

Endometrium

Thickness: 3.9 mm. The endometrial stripe appears to split near the
fundus.

Right ovary

Measurements: 3.2 x 1.8 x 2.5 cm. Normal appearance/no adnexal mass.

Left ovary

Measurements: 2.9 x 1.7 x 2.4 cm. Normal appearance/no adnexal mass.

Other findings

No abnormal free fluid.
IMPRESSION: 1. The ovaries are normal in appearance. No cause for pain
identified.
2. The endometrial stripe complex appears to split near the fundus
into a right and left form. This could represent a mildly septated
uterus.

## 2020-08-01 DIAGNOSIS — Z111 Encounter for screening for respiratory tuberculosis: Secondary | ICD-10-CM | POA: Diagnosis not present

## 2020-12-09 DIAGNOSIS — R109 Unspecified abdominal pain: Secondary | ICD-10-CM | POA: Diagnosis not present

## 2020-12-09 DIAGNOSIS — Z202 Contact with and (suspected) exposure to infections with a predominantly sexual mode of transmission: Secondary | ICD-10-CM | POA: Diagnosis not present

## 2020-12-28 ENCOUNTER — Ambulatory Visit (INDEPENDENT_AMBULATORY_CARE_PROVIDER_SITE_OTHER): Payer: Medicaid Other | Admitting: Family

## 2020-12-28 ENCOUNTER — Encounter: Payer: Self-pay | Admitting: Family

## 2020-12-28 ENCOUNTER — Other Ambulatory Visit: Payer: Self-pay

## 2020-12-28 VITALS — BP 126/78 | HR 92 | Ht 64.0 in | Wt 209.8 lb

## 2020-12-28 DIAGNOSIS — F411 Generalized anxiety disorder: Secondary | ICD-10-CM

## 2020-12-28 DIAGNOSIS — Z8659 Personal history of other mental and behavioral disorders: Secondary | ICD-10-CM

## 2020-12-28 DIAGNOSIS — F4323 Adjustment disorder with mixed anxiety and depressed mood: Secondary | ICD-10-CM

## 2020-12-28 DIAGNOSIS — Z975 Presence of (intrauterine) contraceptive device: Secondary | ICD-10-CM | POA: Diagnosis not present

## 2020-12-28 DIAGNOSIS — D649 Anemia, unspecified: Secondary | ICD-10-CM | POA: Diagnosis not present

## 2020-12-28 DIAGNOSIS — E559 Vitamin D deficiency, unspecified: Secondary | ICD-10-CM | POA: Diagnosis not present

## 2020-12-28 DIAGNOSIS — G479 Sleep disorder, unspecified: Secondary | ICD-10-CM

## 2020-12-28 LAB — COMPREHENSIVE METABOLIC PANEL
AG Ratio: 1.8 (calc) (ref 1.0–2.5)
ALT: 13 U/L (ref 6–29)
AST: 15 U/L (ref 10–30)
Albumin: 4.6 g/dL (ref 3.6–5.1)
Alkaline phosphatase (APISO): 45 U/L (ref 31–125)
BUN: 7 mg/dL (ref 7–25)
CO2: 26 mmol/L (ref 20–32)
Calcium: 9.5 mg/dL (ref 8.6–10.2)
Chloride: 109 mmol/L (ref 98–110)
Creat: 0.7 mg/dL (ref 0.50–0.96)
Globulin: 2.6 g/dL (calc) (ref 1.9–3.7)
Glucose, Bld: 79 mg/dL (ref 65–99)
Potassium: 3.9 mmol/L (ref 3.5–5.3)
Sodium: 141 mmol/L (ref 135–146)
Total Bilirubin: 0.4 mg/dL (ref 0.2–1.2)
Total Protein: 7.2 g/dL (ref 6.1–8.1)

## 2020-12-28 LAB — VITAMIN D 25 HYDROXY (VIT D DEFICIENCY, FRACTURES): Vit D, 25-Hydroxy: 21 ng/mL — ABNORMAL LOW (ref 30–100)

## 2020-12-28 LAB — B12 AND FOLATE PANEL
Folate: 8.9 ng/mL
Vitamin B-12: 556 pg/mL (ref 200–1100)

## 2020-12-28 LAB — FERRITIN: Ferritin: 31 ng/mL (ref 16–154)

## 2020-12-28 LAB — TSH: TSH: 0.94 mIU/L

## 2020-12-28 LAB — T4, FREE: Free T4: 1.1 ng/dL (ref 0.8–1.8)

## 2020-12-28 MED ORDER — HYDROXYZINE HCL 25 MG PO TABS: 25.0000 mg | ORAL_TABLET | Freq: Every evening | ORAL | 0 refills | Status: DC | PRN
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
## 2020-12-30 ENCOUNTER — Encounter: Payer: Self-pay | Admitting: Family

## 2021-01-03 ENCOUNTER — Telehealth (INDEPENDENT_AMBULATORY_CARE_PROVIDER_SITE_OTHER): Payer: Medicaid Other | Admitting: Family

## 2021-01-03 ENCOUNTER — Encounter: Payer: Self-pay | Admitting: Family

## 2021-01-03 DIAGNOSIS — F4323 Adjustment disorder with mixed anxiety and depressed mood: Secondary | ICD-10-CM | POA: Diagnosis not present

## 2021-01-03 DIAGNOSIS — G479 Sleep disorder, unspecified: Secondary | ICD-10-CM | POA: Diagnosis not present

## 2021-01-03 MED ORDER — ESCITALOPRAM OXALATE 10 MG PO TABS
10.0000 mg | ORAL_TABLET | Freq: Every day | ORAL | 0 refills | Status: DC
Start: 2021-01-03 — End: 2021-05-17

## 2021-01-03 MED ORDER — VITAMIN D (ERGOCALCIFEROL) 1.25 MG (50000 UNIT) PO CAPS: 50000.0000 [IU] | ORAL_CAPSULE | ORAL | 0 refills | Status: DC

## 2021-01-06 ENCOUNTER — Encounter: Payer: Self-pay | Admitting: Family
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
## 2021-01-15 ENCOUNTER — Ambulatory Visit (INDEPENDENT_AMBULATORY_CARE_PROVIDER_SITE_OTHER): Payer: Medicaid Other | Admitting: Clinical

## 2021-01-15 DIAGNOSIS — G479 Sleep disorder, unspecified: Secondary | ICD-10-CM | POA: Diagnosis not present

## 2021-01-15 DIAGNOSIS — F4323 Adjustment disorder with mixed anxiety and depressed mood: Secondary | ICD-10-CM | POA: Diagnosis not present

## 2021-01-15 DIAGNOSIS — Z8659 Personal history of other mental and behavioral disorders: Secondary | ICD-10-CM
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
## 2021-01-22 ENCOUNTER — Ambulatory Visit (INDEPENDENT_AMBULATORY_CARE_PROVIDER_SITE_OTHER): Payer: Medicaid Other | Admitting: Clinical

## 2021-01-22 DIAGNOSIS — F4323 Adjustment disorder with mixed anxiety and depressed mood: Secondary | ICD-10-CM

## 2021-01-22 NOTE — BH Specialist Note (Signed)
Integrated Behavioral Health via Telemedicine Visit  01/22/2021 MARIO VOONG [id number]  4:08 pm - Sent video link [phone]  Number of Integrated Behavioral Health visits: 2 Session Start time: 4:10pm  Session End time: 4:30pm Total time: 20  Referring Provider: Beatriz Stallion, FNP Patient/Family location: Work - [place]) Adriana Wright Provider location: [facility] office All persons participating in visit: Adriana Wright & this [facility] Wilfred Lacy) Types of Service: Individual psychotherapy and Video visit  I connected with Adriana Wright via  Telephone or Video Enabled Telemedicine Application  (Video is Caregility application) and verified that I am speaking with the correct person using two identifiers. Discussed confidentiality: Yes  Adriana Wright acknowledged that this [facility] was in the office and if she felt that she didn't want to talk at work then it was fine. Adriana Wright reported there was no customers so she was fine talking.  I discussed the limitations of telemedicine and the availability of in person appointments.  Discussed there is a possibility of technology failure and discussed alternative modes of communication if that failure occurs.  I discussed that engaging in this telemedicine visit, they consent to the provision of behavioral healthcare and the services will be billed under their insurance.  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 - trying to find another car Duration of problem: week; Severity of problem: mild  Patient and/or Family's Strengths/Protective Factors: Social and Emotional competence, Concrete supports in place (healthy food, safe  environments, etc.), and Sense of purpose  Goals Addressed: Patient will:  Increase knowledge and/or ability of: coping skills and healthy habits (This [facility] just sent via MyChart additional information today that pt will review)  Demonstrate ability to:  continue current coping skills that she started this month with drinking water, eating breakfast & praying on a consistent basis.  Progress towards Goals: Ongoing  Interventions: Interventions utilized:  Supportive Counseling and Reviewed goals, identified accomplishments and barriers to implementing healthy coping skills Standardized Assessments completed: Not Needed  Patient and/or Family Response:  Although Adriana Wright has had stressful situations this past week, she's been able to cope it with it and has been able to move forward with her goals. Adriana Wright has been more physical (moving more); she's been able to move to her new place; has a job [employer]; and has a loan for her car.  Assessment: Patient currently experiencing stressful situations but has been able to cope with them.  Adriana Wright has not eaten today due to being called to work but acknowledged the importance of getting food into her body.  She reported she's been drinking water and has been physically active.  Adriana Wright acknowledged her strengths and accomplishments in the past week.  Patient may benefit from continuing to let go of things she cannot control and reframe the situation in order move onto a healthier situation for herself.  Plan: Follow up with behavioral health clinician on : Adriana Wright will message this [facility] for next appointment when she learns her schedule for next week. Behavioral recommendations:  - Continue to eat, drink water, and physically move. - Write down her skills sets and accomplishments so she recognizes it as well as others  Adriana Wright agreeable to plan above.  I discussed the assessment and  treatment plan with the patient and/or parent/guardian. They were  provided an opportunity to ask questions and all were answered. They agreed with the plan and demonstrated an understanding of the instructions.   They were advised to call back or seek an in-person evaluation if the symptoms worsen or if the condition fails to improve as anticipated.  Shenita Trego Ed Blalock, LCSW

## 2021-01-24 ENCOUNTER — Encounter: Payer: Self-pay | Admitting: Family

## 2021-01-24 ENCOUNTER — Other Ambulatory Visit: Payer: Self-pay | Admitting: Family

## 2021-01-24 ENCOUNTER — Telehealth: Payer: Medicaid Other | Admitting: Family

## 2021-01-24 DIAGNOSIS — F4323 Adjustment disorder with mixed anxiety and depressed mood: Secondary | ICD-10-CM

## 2021-01-24 DIAGNOSIS — Z8659 Personal history of other mental and behavioral disorders: Secondary | ICD-10-CM

## 2021-01-24 DIAGNOSIS — G479 Sleep disorder, unspecified: Secondary | ICD-10-CM

## 2021-01-26 ENCOUNTER — Telehealth (INDEPENDENT_AMBULATORY_CARE_PROVIDER_SITE_OTHER): Payer: Medicaid Other | Admitting: Family

## 2021-01-26 ENCOUNTER — Encounter: Payer: Self-pay | Admitting: Family

## 2021-01-26 DIAGNOSIS — G479 Sleep disorder, unspecified: Secondary | ICD-10-CM | POA: Diagnosis not present

## 2021-01-26 DIAGNOSIS — F4323 Adjustment disorder with mixed anxiety and depressed mood: Secondary | ICD-10-CM

## 2021-01-26 DIAGNOSIS — Z8659 Personal history of other mental and behavioral disorders: Secondary | ICD-10-CM | POA: Diagnosis not present

## 2021-01-26 NOTE — Progress Notes (Signed)
THIS RECORD MAY CONTAIN CONFIDENTIAL INFORMATION THAT SHOULD NOT BE RELEASED WITHOUT REVIEW OF THE SERVICE PROVIDER.  Virtual Follow-Up Visit via Video Note  I connected with Adriana Wright  on 01/26/21 at  9:30 AM EDT by a video enabled telemedicine application and verified that I am speaking with the correct person using two identifiers.   Patient/parent location: new apartment in [place]    I discussed the limitations of evaluation and management by telemedicine and the availability of in person appointments.  I discussed that the purpose of this telehealth visit is to provide medical care while limiting exposure to the novel coronavirus.  The patient expressed understanding and agreed to proceed.   Adriana Wright is a 20 y.o. female referred by Theadore Nan, MD here today for follow-up of adjustment disorder with mixed anxiety and depressed mood.     History was provided by the patient.  Supervising Physician: Dr. Delorse Lek  Plan from Last Visit:   -lexapro 10 mg  -hydroxyzine 25 mg PRN and HS   Chief Complaint: -adjustment disorder with mixed anxiety and depressed mood -sleep disturbance   History of Present Illness:  -stopped using hydroxyzine for a while during recent move  -now back in [place]; two roommates from school  -going well so far - moved in 8/14 -mas[employer] program starts 9/5 ([employer]  -been talking to [facility] and that has been really helpful  -feels things are getting better -no SE from meds  -no sad days, not feeling down and out anymore -trying to work on eating more   Allergies  Allergen Reactions   Latex Other (See Comments)    Skin sensitivity/burned skin   Penicillins Hives and Diarrhea    Has patient had a PCN reaction causing immediate rash, facial/tongue/throat swelling, SOB or lightheadedness with hypotension: Unknown Has patient had a PCN reaction causing severe rash involving mucus membranes or skin necrosis:  Unknown Has patient had a PCN reaction that required hospitalization: Unknown Has patient had a PCN reaction occurring within the last 10 years: Unknown If all of the above answers are "NO", then may proceed with Cephalosporin use.    Outpatient Medications Prior to Visit  Medication Sig Dispense Refill   cetirizine (ZYRTEC ALLERGY) 10 MG tablet Take 1 tablet (10 mg total) by mouth daily. 20 tablet 0   escitalopram (LEXAPRO) 10 MG tablet Take 1 tablet (10 mg total) by mouth daily. 30 tablet 0   etonogestrel (NEXPLANON) 68 MG IMPL implant 1 each by Subdermal route once.     hydrOXYzine (ATARAX/VISTARIL) 25 MG tablet TAKE 1 TABLET BY MOUTH AT BEDTIME AS NEEDED. 30 tablet 0   Vitamin D, Ergocalciferol, (DRISDOL) 1.25 MG (50000 UNIT) CAPS capsule Take 1 capsule (50,000 Units total) by mouth every 7 (seven) days. 8 capsule 0   No facility-administered medications prior to visit.     Patient Active Problem List   Diagnosis Date Noted   Postoperative breast asymmetry 07/02/2019   Symptomatic mammary hypertrophy 05/15/2018   Neck pain 05/15/2018   Back pain 05/15/2018   Adjustment disorder with anxiety 10/23/2015   Rhinitis, allergic 07/18/2015   Bereavement 07/18/2015   Acne 07/16/2013   Mild persistent asthma, well controlled 07/16/2013   Dysmenorrhea 07/16/2013   The following portions of the patient's history were reviewed and updated as appropriate: allergies, current medications, past family history, past medical history, past social history, past surgical history, and problem list.  Visual Observations/Objective:  General Appearance: Well nourished well developed, in  no apparent distress.  Eyes: conjunctiva no swelling or erythema ENT/Mouth: No hoarseness, No cough for duration of visit.  Neck: Supple  Respiratory: Respiratory effort normal, normal rate, no retractions or distress.   Cardio: Appears well-perfused, noncyanotic Musculoskeletal: no obvious deformity Skin: visible  skin without rashes, ecchymosis, erythema Neuro: Awake and oriented X 3,  Psych: Brighter affect, Insight and Judgment appropriate.    Assessment/Plan: 1. Adjustment disorder with mixed anxiety and depressed mood 2. History of recent stressful life event 3. Sleep disturbance -lexapro 10 mg daily  -hydroxyzine 25 mg PRN  -need PHQSADS updated at next [facility] or my follow-up please   BH screenings:  PHQ-SADS Last 3 Score only 12/30/2020  PHQ-15 Score 12  Total GAD-7 Score 15  PHQ-9 Total Score 15    Screens discussed with patient and parent and adjustments to plan made accordingly.   I discussed the assessment and treatment plan with the patient and/or parent/guardian.  They were provided an opportunity to ask questions and all were answered.  They agreed with the plan and demonstrated an understanding of the instructions. They were advised to call back or seek an in-person evaluation in the emergency room if the symptoms worsen or if the condition fails to improve as anticipated.   Follow-up:   week of 9/5   Medical decision-making:   I spent 30 minutes on this telehealth visit inclusive of face-to-face video and care coordination time I was located remote in [place] during this encounter.   Georges Mouse, NP    CC: Theadore Nan, MD, Theadore Nan, MD

## 2021-02-28 ENCOUNTER — Other Ambulatory Visit: Payer: Self-pay

## 2021-02-28 ENCOUNTER — Telehealth: Payer: Medicaid Other | Admitting: Family

## 2021-03-01 ENCOUNTER — Encounter: Payer: Self-pay | Admitting: Family

## 2021-03-01 NOTE — Progress Notes (Signed)
Closed for admin purposes

## 2021-03-07 ENCOUNTER — Other Ambulatory Visit: Payer: Self-pay | Admitting: Family

## 2021-04-06 ENCOUNTER — Encounter: Payer: Self-pay | Admitting: Family

## 2021-05-17 ENCOUNTER — Ambulatory Visit: Payer: Medicaid Other | Admitting: Pediatrics

## 2021-05-17 ENCOUNTER — Other Ambulatory Visit: Payer: Self-pay

## 2021-05-17 ENCOUNTER — Other Ambulatory Visit (HOSPITAL_COMMUNITY)
Admission: RE | Admit: 2021-05-17 | Discharge: 2021-05-17 | Disposition: A | Discharge status: Home / Self Care | Payer: Medicaid Other | Source: Ambulatory Visit | Attending: Pediatrics | Admitting: Pediatrics

## 2021-05-17 ENCOUNTER — Encounter: Payer: Self-pay | Admitting: Pediatrics

## 2021-05-17 VITALS — BP 127/80 | HR 80 | Ht 64.25 in | Wt 212.8 lb

## 2021-05-17 DIAGNOSIS — Z3046 Encounter for surveillance of implantable subdermal contraceptive: Secondary | ICD-10-CM | POA: Diagnosis not present

## 2021-05-17 DIAGNOSIS — Z3202 Encounter for pregnancy test, result negative: Secondary | ICD-10-CM | POA: Diagnosis not present

## 2021-05-17 DIAGNOSIS — R635 Abnormal weight gain: Secondary | ICD-10-CM

## 2021-05-17 DIAGNOSIS — Z6836 Body mass index (BMI) 36.0-36.9, adult: Secondary | ICD-10-CM | POA: Insufficient documentation

## 2021-05-17 DIAGNOSIS — E559 Vitamin D deficiency, unspecified: Secondary | ICD-10-CM | POA: Diagnosis not present

## 2021-05-17 DIAGNOSIS — Z113 Encounter for screening for infections with a predominantly sexual mode of transmission: Secondary | ICD-10-CM

## 2021-05-17 DIAGNOSIS — F4323 Adjustment disorder with mixed anxiety and depressed mood: Secondary | ICD-10-CM

## 2021-05-17 DIAGNOSIS — N946 Dysmenorrhea, unspecified: Secondary | ICD-10-CM

## 2021-05-17 LAB — POCT URINE PREGNANCY: Preg Test, Ur: NEGATIVE

## 2021-05-17 MED ORDER — LEVONORGESTREL 1.5 MG PO TABS
1.5000 mg | ORAL_TABLET | Freq: Once | ORAL | 0 refills | Status: AC
Start: 2021-05-17 — End: 2021-05-17

## 2021-05-17 MED ORDER — NORELGESTROMIN-ETH ESTRADIOL 150-35 MCG/24HR TD PTWK: 1.0000 | MEDICATED_PATCH | TRANSDERMAL | 12 refills | Status: AC

## 2021-05-17 NOTE — Progress Notes (Signed)
History was provided by the patient.  Adriana Wright is a 20 y.o. female who is here for nexplanon removal.  Theadore Nan, MD   HPI:  Pt reports it is time for nexplanon removal. Her weight gain is also bothersome to her. She is interested in taking out implant and maybe using patch. Also interested in having plan B at home if needed.   She is infrequently sexually active with men. She does not want to be pregnant but her mom wants her to. She has been in an on and off relationship with a guy who is in [facility] in [place] so not frequenlty sexually active.   Working on improving healthy habits and lifestyle but finds it hard to eat 3 meals daily due to work. She is open to a dietitian referral to help with this.   No LMP recorded. Patient has had an implant.   Patient Active Problem List   Diagnosis Date Noted   Postoperative breast asymmetry 07/02/2019   Symptomatic mammary hypertrophy 05/15/2018   Neck pain 05/15/2018   Back pain 05/15/2018   Adjustment disorder with anxiety 10/23/2015   Rhinitis, allergic 07/18/2015   Bereavement 07/18/2015   Acne 07/16/2013   Mild persistent asthma, well controlled 07/16/2013   Dysmenorrhea 07/16/2013    Current Outpatient Medications on File Prior to Visit  Medication Sig Dispense Refill   cetirizine (ZYRTEC ALLERGY) 10 MG tablet Take 1 tablet (10 mg total) by mouth daily. 20 tablet 0   escitalopram (LEXAPRO) 10 MG tablet Take 1 tablet (10 mg total) by mouth daily. 30 tablet 0   etonogestrel (NEXPLANON) 68 MG IMPL implant 1 each by Subdermal route once.     hydrOXYzine (ATARAX/VISTARIL) 25 MG tablet TAKE 1 TABLET BY MOUTH AT BEDTIME AS NEEDED. 30 tablet 0   Vitamin D, Ergocalciferol, (DRISDOL) 1.25 MG (50000 UNIT) CAPS capsule TAKE 1 CAPSULE BY MOUTH EVERY 7 DAYS 8 capsule 0   [DISCONTINUED] albuterol (PROAIR HFA) 108 (90 Base) MCG/ACT inhaler Inhale 1-2 puffs into the lungs every 4 (four) hours as needed for wheezing or shortness of breath.  Always use spacer. 8.5 each 0   No current facility-administered medications on file prior to visit.    Allergies  Allergen Reactions   Latex Other (See Comments)    Skin sensitivity/burned skin   Penicillins Hives and Diarrhea    Has patient had a PCN reaction causing immediate rash, facial/tongue/throat swelling, SOB or lightheadedness with hypotension: Unknown Has patient had a PCN reaction causing severe rash involving mucus membranes or skin necrosis: Unknown Has patient had a PCN reaction that required hospitalization: Unknown Has patient had a PCN reaction occurring within the last 10 years: Unknown If all of the above answers are "NO", then may proceed with Cephalosporin use.      Physical Exam:    Vitals:   05/17/21 1011  BP: 127/80  Pulse: 80  Weight: 212 lb 12.8 oz (96.5 kg)  Height: 5' 4.25" (1.632 m)    Growth percentile SmartLinks can only be used for patients less than 21 years old.  Physical Exam Vitals and nursing note reviewed.  Constitutional:      General: She is not in acute distress.    Appearance: She is well-developed.  Neck:     Thyroid: No thyromegaly.  Cardiovascular:     Rate and Rhythm: Normal rate and regular rhythm.     Heart sounds: No murmur heard. Pulmonary:     Breath sounds: Normal breath sounds.  Abdominal:     Palpations: Abdomen is soft. There is no mass.     Tenderness: There is no abdominal tenderness. There is no guarding.  Musculoskeletal:     Right lower leg: No edema.     Left lower leg: No edema.  Lymphadenopathy:     Cervical: No cervical adenopathy.  Skin:    General: Skin is warm.     Findings: No rash.  Neurological:     Mental Status: She is alert.     Comments: No tremor  Psychiatric:        Mood and Affect: Mood and affect normal.    Assessment/Plan: 1. Encounter for Nexplanon removal See procedure note. Tolerated well.   2. Weight gain Will repeat a few labs today and refer to dietitian. She is  concerned that nexplnaon has caused weight gain and the weight gain has exacerbated her depression so will continue to work on healthy lifestyle  - Amb ref to Medical Nutrition Therapy-MNT - TSH - Hemoglobin A1c  3. Vitamin D deficiency Repeat today. Supplement finished.  - VITAMIN D 25 Hydroxy (Vit-D Deficiency, Fractures)  4. BMI 36.0-36.9,adult Labs as below.  - TSH - Hemoglobin A1c - Lipid panel  5. Adjustment disorder with mixed anxiety and depressed mood R/t weight gain per pt report.   6. Dysmenorrhea Will monitor with patch.   7. Pregnancy examination or test, negative result Neg  - POCT urine pregnancy  8. Routine screening for STI (sexually transmitted infection) Previous hx of trich and chlamydia.  - Urine cytology ancillary only  Return in 3 months   Alfonso Ramus, FNP

## 2021-05-17 NOTE — Progress Notes (Signed)

## 2021-05-17 NOTE — Patient Instructions (Signed)
Referral to dietitian- they will call you  Labs today  Work on eating three meals daily  Come back in 3 months to see how things are  Start patch if you want  Plan b at pharmacy   Your Nexplanon was removed today and is no longer preventing pregnancy.  If you have sex, remember to use condoms to prevent pregnancy and to prevent sexually transmitted infections.  Leave the outside bandage on for 24 hours.  Leave the smaller bandages on for 3-5 days or until they fall off on their own.  Keep the area clean and dry for 3-5 days.  There is usually bruising or swelling at and around the removal site for a few days to a week after the removal.  If you see redness or pus draining from the removal site, call us immediately.  We would like you to return to the clinic for a follow-up visit in 1 month.  You can call [facility] 24 hours a day with any questions or concerns.  There is always a nurse or doctor available to take your call.  Call 9-1-1 if you have a life-threatening emergency.  For anything else, please call us at [phone] before heading to the ER.

## 2021-05-18 LAB — LIPID PANEL
Cholesterol: 186 mg/dL (ref <200)
HDL: 75 mg/dL (ref >=50)
LDL Cholesterol (Calc): 98 mg/dL (calc)
Non-HDL Cholesterol (Calc): 111 mg/dL (calc) (ref <130)
Total CHOL/HDL Ratio: 2.5 (calc) (ref <5.0)
Triglycerides: 51 mg/dL (ref <150)

## 2021-05-18 LAB — HEMOGLOBIN A1C
Hgb A1c MFr Bld: 5.1 % of total Hgb (ref <5.7)
Mean Plasma Glucose: 100 mg/dL
eAG (mmol/L): 5.5 mmol/L

## 2021-05-18 LAB — VITAMIN D 25 HYDROXY (VIT D DEFICIENCY, FRACTURES): Vit D, 25-Hydroxy: 25 ng/mL — ABNORMAL LOW (ref 30–100)

## 2021-05-18 LAB — URINE CYTOLOGY ANCILLARY ONLY
Chlamydia: NEGATIVE
Comment: NEGATIVE
Comment: NEGATIVE
Comment: NORMAL
Neisseria Gonorrhea: NEGATIVE
Trichomonas: NEGATIVE

## 2021-05-18 LAB — TSH: TSH: 1.9 mIU/L

## 2021-06-05 ENCOUNTER — Other Ambulatory Visit: Payer: Self-pay

## 2021-06-05 ENCOUNTER — Encounter: Payer: Medicaid Other | Attending: Pediatrics | Admitting: Registered"

## 2021-06-05 ENCOUNTER — Encounter: Payer: Self-pay | Admitting: Registered"

## 2021-06-05 DIAGNOSIS — R635 Abnormal weight gain: Secondary | ICD-10-CM | POA: Insufficient documentation

## 2021-06-05 NOTE — Patient Instructions (Signed)
-   Continue to aim for 3 meals a day especially during the work day.   - Pack snack bag for workdays. Include: Peanut butter crackers Cheese and crackers Trail mix Fruit and nuts/nut butter  - Aim to increase physical activity to at least 15-20 min, 2 times/week on Saturday and Sunday. Workout with friend.

## 2021-06-05 NOTE — Progress Notes (Signed)
Medical Nutrition Therapy  Appointment Start time:  11:33  Appointment End time:  12:35  Primary concerns today: trying to get back to the body she feels comfortable in  Referral diagnosis: weight gain Preferred learning style:  no preference indicated Learning readiness:  ready, change in progress   NUTRITION ASSESSMENT   Pt arrives stating she feels discouraged when putting on certain clothes and unable to fit them. States she started Nexplanon in 2018 and had weight gain. States in 2018-19 her breasts grew from C cup to M cup; had breast reduction 11/2018 which reduced chest pains.  States she recently had Nexplanon removed. States she started taking birth control at age 20 due to an ovarian cyst.   States she has stopped eating beef for 3 years and has reintroduced it about a month ago. States she is trying to eat 3 meals a day more than 1-2 times a week. States work schedule prevents her from eating 3 meals a day when working. States work doesn't have set time for lunch; some days unable to eat lunch. States she tries to make sure she is constantly drinking something to help with not eating at times. Reports typically work hours are 8-5:30 pm but some days are later than others.   States she used to participate in dance since age 38, stopped when having breast reduction surgery, and has not resumed since. States she is completely healed but nervous to engage in dance again.   States she lives with 3 roommates.   Pt expectations: steps on better eating, how to incorporate right eating plan/method into her life, and how to get to a healthy weight.    Clinical Medical Hx: none Medications: See list Labs: low Vitamin D (25) Notable Signs/Symptoms: weight gain  Lifestyle & Dietary Hx  Estimated daily fluid intake: 108 oz Supplements: See list Sleep: 6+ hrs Stress / self-care: drive, journaling, retail therapy, and listening to music Current average weekly physical activity: none  reported  24-Hr Dietary Recall First Meal (9 am): salmon croquette  Snack:  Second Meal (3-4 pm): mac and cheese + stuffed chicken breast with sundried tomatoes + mozzarella  Snack:  Third Meal (8:30 pm): spaghetti  with spaghetti sauce and alfredo sauce + meat + green tea Snack:  Beverages: water (3*16 oz; 48 oz), cranberry/orange juice, apple juice tea (3*20 oz; 60 oz), Red Bull (sometimes), wine (sometimes)   NUTRITION DIAGNOSIS  NB-1.1 Food and nutrition-related knowledge deficit As related to balanced meals.  As evidenced by dietary recall.   NUTRITION INTERVENTION  Nutrition education (E-1) on the following topics: Nutrition education and counseling. Pt was educated on the benefits of eating a variety of food groups at each meal. Discussed the purpose of each food group and ways to create balance with already established regimen. Discussed eating every 3-5 hours to help adequately nourish body and ways to increase water intake. Discussed importance of physical activity. Pt agreed with goals listed.   Handouts Provided Include  none  Learning Style & Readiness for Change Teaching method utilized: Visual & Auditory  Demonstrated degree of understanding via: Teach Back  Barriers to learning/adherence to lifestyle change: work-life balance  Goals Established by Pt Continue to aim for 3 meals a day especially during the work day.  Pack snack bag for workdays. Include: Peanut butter crackers Cheese and crackers Trail mix Fruit and nuts/nut butter Aim to increase physical activity to at least 15-20 min, 2 times/week on Saturday and Sunday. Workout with friend.  MONITORING & EVALUATION Dietary intake, weekly physical activity.  Next Steps  Patient is to follow-up prn.

## 2021-08-16 ENCOUNTER — Ambulatory Visit: Payer: Medicaid Other | Admitting: Pediatrics

## 2021-09-12 ENCOUNTER — Encounter: Payer: Self-pay | Admitting: *Deleted
